# Patient Record
Sex: Female | Born: 1960 | Race: Black or African American | Hispanic: No | Marital: Single | Smoking: Former smoker
Health system: Southern US, Community
[De-identification: ages and names within clinical notes are randomized; demographics above are authoritative.]

## PROBLEM LIST (undated history)

## (undated) DIAGNOSIS — F32A Depression, unspecified: Secondary | ICD-10-CM

## (undated) DIAGNOSIS — M545 Low back pain, unspecified: Secondary | ICD-10-CM

## (undated) DIAGNOSIS — G8929 Other chronic pain: Secondary | ICD-10-CM

## (undated) DIAGNOSIS — F419 Anxiety disorder, unspecified: Secondary | ICD-10-CM

## (undated) DIAGNOSIS — J45998 Other asthma: Secondary | ICD-10-CM

## (undated) DIAGNOSIS — C50919 Malignant neoplasm of unspecified site of unspecified female breast: Secondary | ICD-10-CM

## (undated) DIAGNOSIS — R51 Headache: Secondary | ICD-10-CM

## (undated) DIAGNOSIS — D649 Anemia, unspecified: Secondary | ICD-10-CM

## (undated) DIAGNOSIS — M1990 Unspecified osteoarthritis, unspecified site: Secondary | ICD-10-CM

## (undated) DIAGNOSIS — R519 Headache, unspecified: Secondary | ICD-10-CM

## (undated) DIAGNOSIS — F329 Major depressive disorder, single episode, unspecified: Secondary | ICD-10-CM

## (undated) HISTORY — PX: BREAST LUMPECTOMY: SHX2

## (undated) HISTORY — PX: DILATION AND CURETTAGE OF UTERUS: SHX78

---

## 1997-08-06 ENCOUNTER — Ambulatory Visit (HOSPITAL_COMMUNITY): Admission: RE | Admit: 1997-08-06 | Discharge: 1997-08-06 | Payer: Self-pay | Admitting: *Deleted

## 1997-08-17 ENCOUNTER — Ambulatory Visit (HOSPITAL_COMMUNITY): Admission: RE | Admit: 1997-08-17 | Discharge: 1997-08-17 | Payer: Self-pay | Admitting: *Deleted

## 1997-10-15 ENCOUNTER — Ambulatory Visit (HOSPITAL_COMMUNITY): Admission: RE | Admit: 1997-10-15 | Discharge: 1997-10-15 | Payer: Self-pay | Admitting: *Deleted

## 1998-01-02 ENCOUNTER — Inpatient Hospital Stay (HOSPITAL_COMMUNITY): Admission: AD | Admit: 1998-01-02 | Discharge: 1998-01-02 | Payer: Self-pay | Admitting: *Deleted

## 1998-01-16 ENCOUNTER — Inpatient Hospital Stay (HOSPITAL_COMMUNITY): Admission: AD | Admit: 1998-01-16 | Discharge: 1998-01-18 | Payer: Self-pay | Admitting: *Deleted

## 1998-05-19 ENCOUNTER — Emergency Department (HOSPITAL_COMMUNITY): Admission: EM | Admit: 1998-05-19 | Discharge: 1998-05-19 | Payer: Self-pay | Admitting: Emergency Medicine

## 1999-12-31 ENCOUNTER — Encounter: Payer: Self-pay | Admitting: Emergency Medicine

## 1999-12-31 ENCOUNTER — Emergency Department (HOSPITAL_COMMUNITY): Admission: EM | Admit: 1999-12-31 | Discharge: 1999-12-31 | Payer: Self-pay

## 2002-01-31 ENCOUNTER — Emergency Department (HOSPITAL_COMMUNITY): Admission: EM | Admit: 2002-01-31 | Discharge: 2002-01-31 | Payer: Self-pay | Admitting: Emergency Medicine

## 2002-01-31 ENCOUNTER — Encounter: Payer: Self-pay | Admitting: Emergency Medicine

## 2002-11-13 ENCOUNTER — Other Ambulatory Visit: Admission: RE | Admit: 2002-11-13 | Discharge: 2002-11-13 | Payer: Self-pay | Admitting: *Deleted

## 2003-01-19 ENCOUNTER — Encounter: Admission: RE | Admit: 2003-01-19 | Discharge: 2003-01-19 | Payer: Self-pay | Admitting: Infectious Diseases

## 2003-12-17 ENCOUNTER — Other Ambulatory Visit: Admission: RE | Admit: 2003-12-17 | Discharge: 2003-12-17 | Payer: Self-pay | Admitting: *Deleted

## 2004-03-04 ENCOUNTER — Encounter: Admission: RE | Admit: 2004-03-04 | Discharge: 2004-03-04 | Payer: Self-pay | Admitting: *Deleted

## 2004-05-08 HISTORY — PX: BREAST BIOPSY: SHX20

## 2005-01-16 ENCOUNTER — Other Ambulatory Visit: Admission: RE | Admit: 2005-01-16 | Discharge: 2005-01-16 | Payer: Self-pay | Admitting: *Deleted

## 2005-05-24 ENCOUNTER — Encounter: Admission: RE | Admit: 2005-05-24 | Discharge: 2005-05-24 | Payer: Self-pay | Admitting: *Deleted

## 2005-06-14 ENCOUNTER — Encounter: Admission: RE | Admit: 2005-06-14 | Discharge: 2005-06-14 | Payer: Self-pay | Admitting: Pediatric Nephrology

## 2005-07-11 ENCOUNTER — Emergency Department (HOSPITAL_COMMUNITY): Admission: EM | Admit: 2005-07-11 | Discharge: 2005-07-11 | Payer: Self-pay | Admitting: Emergency Medicine

## 2005-12-21 ENCOUNTER — Encounter (INDEPENDENT_AMBULATORY_CARE_PROVIDER_SITE_OTHER): Payer: Self-pay | Admitting: *Deleted

## 2005-12-21 ENCOUNTER — Encounter: Admission: RE | Admit: 2005-12-21 | Discharge: 2005-12-21 | Payer: Self-pay | Admitting: *Deleted

## 2005-12-21 ENCOUNTER — Encounter (INDEPENDENT_AMBULATORY_CARE_PROVIDER_SITE_OTHER): Payer: Self-pay | Admitting: Radiology

## 2005-12-29 ENCOUNTER — Encounter: Admission: RE | Admit: 2005-12-29 | Discharge: 2005-12-29 | Payer: Self-pay | Admitting: *Deleted

## 2005-12-31 ENCOUNTER — Encounter: Admission: RE | Admit: 2005-12-31 | Discharge: 2005-12-31 | Payer: Self-pay | Admitting: *Deleted

## 2006-01-31 ENCOUNTER — Other Ambulatory Visit: Admission: RE | Admit: 2006-01-31 | Discharge: 2006-01-31 | Payer: Self-pay | Admitting: *Deleted

## 2006-06-08 ENCOUNTER — Encounter: Admission: RE | Admit: 2006-06-08 | Discharge: 2006-06-08 | Payer: Self-pay | Admitting: Obstetrics and Gynecology

## 2006-07-06 ENCOUNTER — Encounter: Admission: RE | Admit: 2006-07-06 | Discharge: 2006-07-06 | Payer: Self-pay | Admitting: Obstetrics and Gynecology

## 2006-07-13 ENCOUNTER — Ambulatory Visit (HOSPITAL_COMMUNITY): Admission: RE | Admit: 2006-07-13 | Discharge: 2006-07-13 | Payer: Self-pay | Admitting: *Deleted

## 2006-07-13 ENCOUNTER — Ambulatory Visit (HOSPITAL_BASED_OUTPATIENT_CLINIC_OR_DEPARTMENT_OTHER): Admission: RE | Admit: 2006-07-13 | Discharge: 2006-07-13 | Payer: Self-pay | Admitting: *Deleted

## 2006-07-13 ENCOUNTER — Encounter (INDEPENDENT_AMBULATORY_CARE_PROVIDER_SITE_OTHER): Payer: Self-pay | Admitting: Specialist

## 2006-07-13 ENCOUNTER — Encounter: Admission: RE | Admit: 2006-07-13 | Discharge: 2006-07-13 | Payer: Self-pay | Admitting: *Deleted

## 2006-07-13 HISTORY — PX: MASTECTOMY, PARTIAL: SHX709

## 2006-07-26 ENCOUNTER — Ambulatory Visit: Payer: Self-pay | Admitting: Oncology

## 2006-08-01 LAB — COMPREHENSIVE METABOLIC PANEL
BUN: 11 mg/dL (ref 6–23)
CO2: 26 mEq/L (ref 19–32)
Calcium: 9 mg/dL (ref 8.4–10.5)
Chloride: 106 mEq/L (ref 96–112)
Creatinine, Ser: 0.81 mg/dL (ref 0.40–1.20)
Glucose, Bld: 80 mg/dL (ref 70–99)
Total Bilirubin: 0.3 mg/dL (ref 0.3–1.2)

## 2006-08-01 LAB — CBC WITH DIFFERENTIAL/PLATELET
Basophils Absolute: 0 10*3/uL (ref 0.0–0.1)
HCT: 36.3 % (ref 34.8–46.6)
HGB: 12.5 g/dL (ref 11.6–15.9)
LYMPH%: 29.3 % (ref 14.0–48.0)
MCHC: 34.4 g/dL (ref 32.0–36.0)
MONO#: 0.4 10*3/uL (ref 0.1–0.9)
NEUT%: 59.4 % (ref 39.6–76.8)
Platelets: 346 10*3/uL (ref 145–400)
WBC: 4.9 10*3/uL (ref 3.9–10.0)
lymph#: 1.4 10*3/uL (ref 0.9–3.3)

## 2006-10-18 ENCOUNTER — Ambulatory Visit: Admission: RE | Admit: 2006-10-18 | Discharge: 2007-01-03 | Payer: Self-pay | Admitting: *Deleted

## 2007-02-21 ENCOUNTER — Encounter: Admission: RE | Admit: 2007-02-21 | Discharge: 2007-02-21 | Payer: Self-pay | Admitting: *Deleted

## 2007-05-20 IMAGING — MG MM MAMMO SCREENING
4 series · 4 of 4 positions shown · non-contrast
Comparison: none

SCREENING MAMMOGRAM:
There is a fibroglandular pattern.  Possible distortion is noted in the left breast.  Spot 
compression views and possibly sonography are recommended for further evaluation.  In the right 
breast, no masses or malignant type calcifications are identified.  Compared with prior studies.

[R CC]
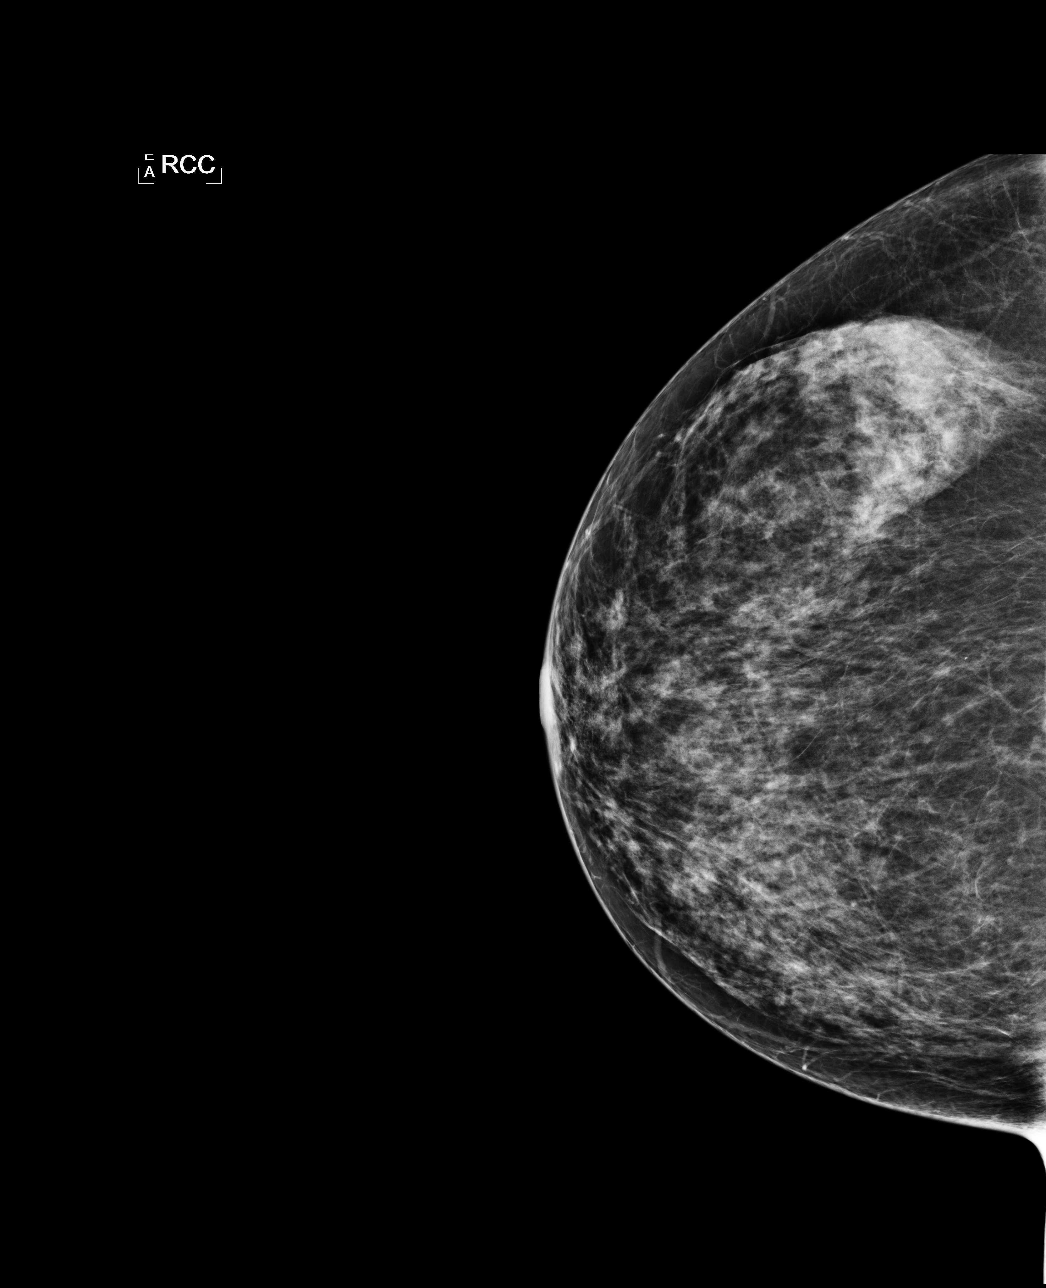

[R MLO]
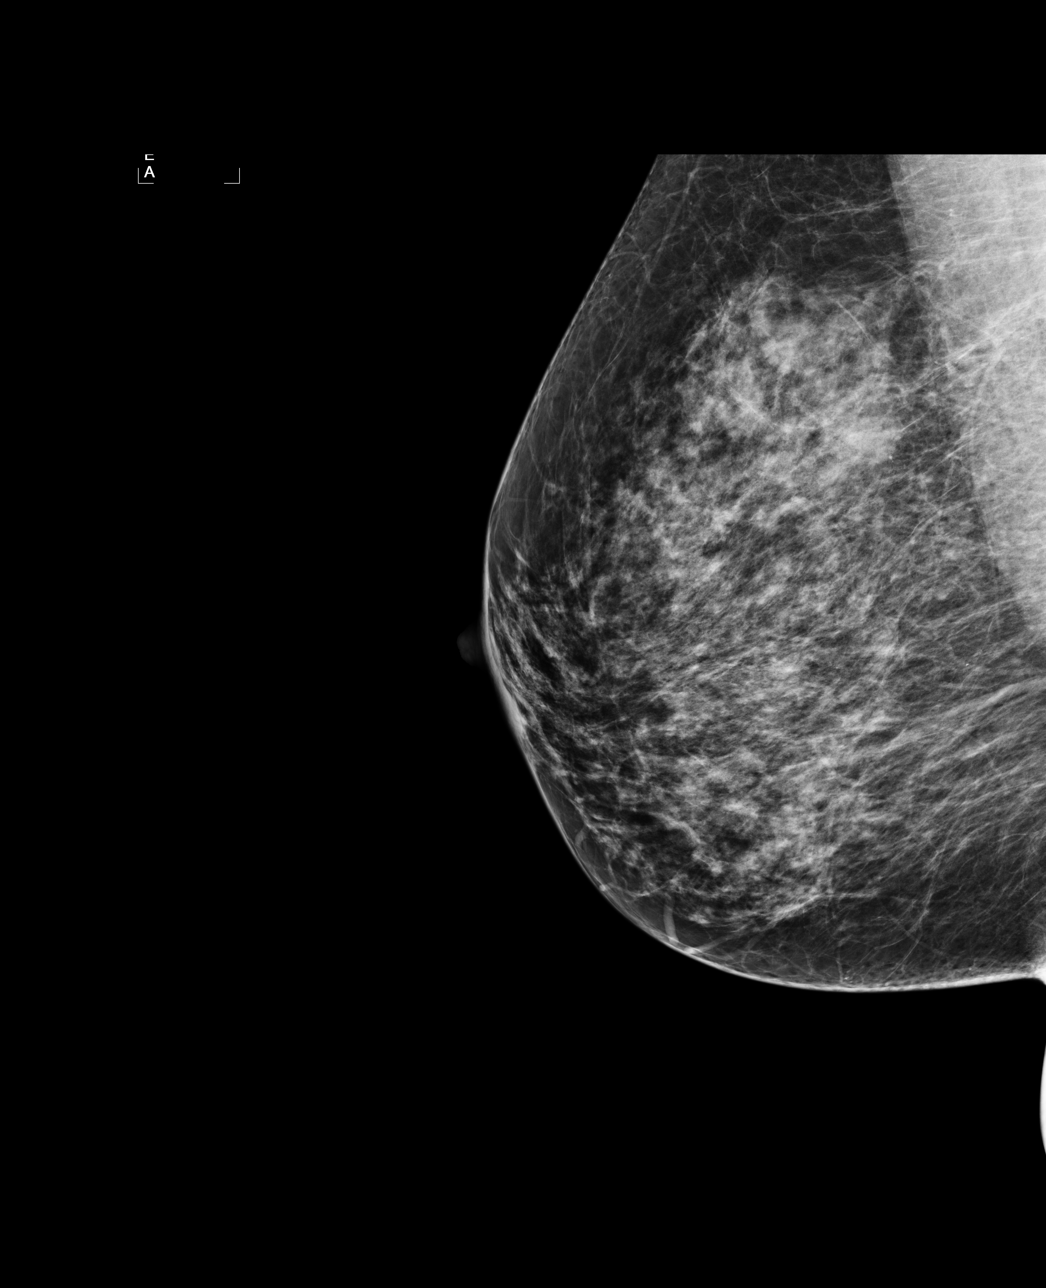

[L CC]
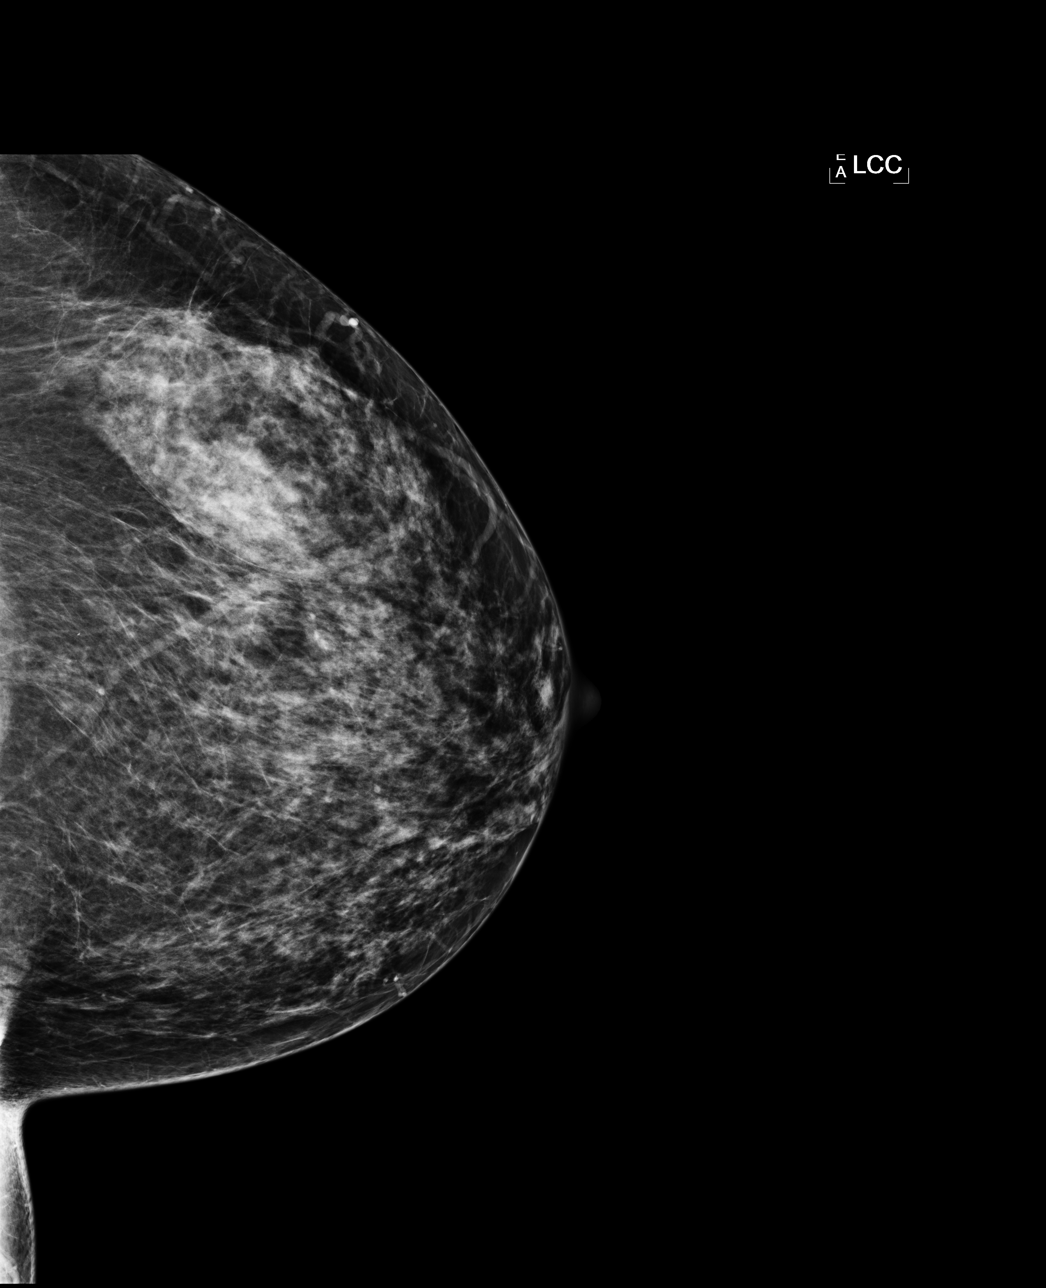

[L MLO]
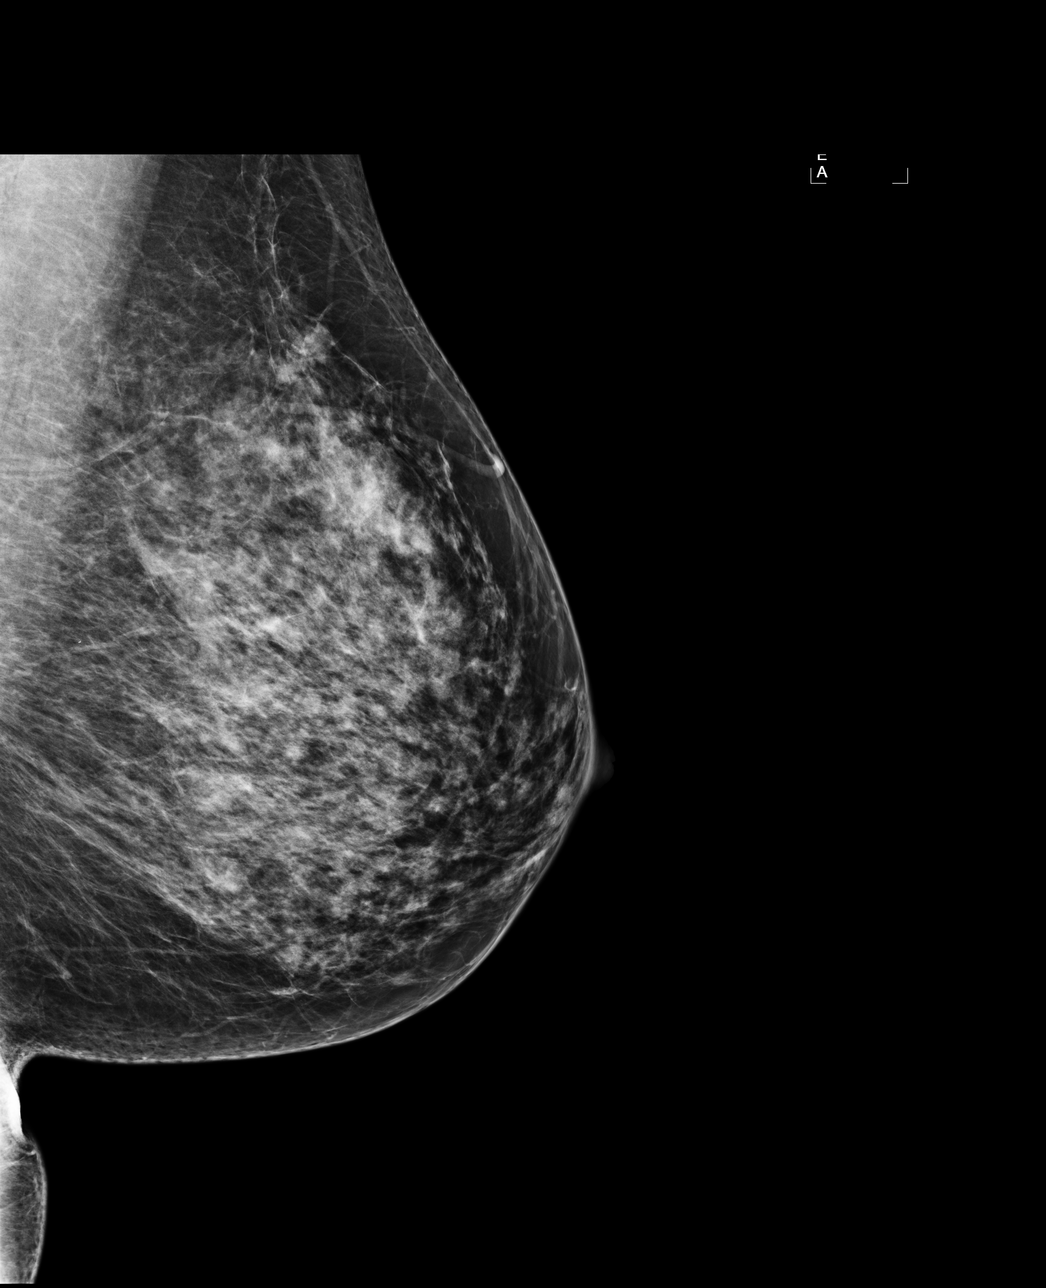

[4 of 4 positions shown; findings below may reference images not displayed]

IMPRESSION: Possible distortion, left breast.  Additional evaluation is indicated.  The patient will be 
contacted for additional studies and a supplementary report will follow.  No specific mammographic 
evidence of malignancy, right breast.

ASSESSMENT: Need additional imaging evaluation and/or prior mammograms for comparison - BI-RADS 0

Further imaging of the left breast.

## 2007-06-10 ENCOUNTER — Encounter: Admission: RE | Admit: 2007-06-10 | Discharge: 2007-06-10 | Payer: Self-pay | Admitting: Obstetrics and Gynecology

## 2007-06-16 ENCOUNTER — Encounter: Admission: RE | Admit: 2007-06-16 | Discharge: 2007-06-16 | Payer: Self-pay | Admitting: Family Medicine

## 2007-12-07 HISTORY — PX: TUBAL LIGATION: SHX77

## 2007-12-18 ENCOUNTER — Encounter: Admission: RE | Admit: 2007-12-18 | Discharge: 2007-12-18 | Payer: Self-pay | Admitting: Obstetrics and Gynecology

## 2007-12-31 ENCOUNTER — Ambulatory Visit (HOSPITAL_COMMUNITY): Admission: RE | Admit: 2007-12-31 | Discharge: 2007-12-31 | Payer: Self-pay | Admitting: Obstetrics and Gynecology

## 2008-06-07 ENCOUNTER — Emergency Department (HOSPITAL_COMMUNITY): Admission: EM | Admit: 2008-06-07 | Discharge: 2008-06-07 | Payer: Self-pay | Admitting: *Deleted

## 2008-06-10 ENCOUNTER — Encounter: Admission: RE | Admit: 2008-06-10 | Discharge: 2008-06-10 | Payer: Self-pay | Admitting: Obstetrics and Gynecology

## 2008-07-08 ENCOUNTER — Emergency Department (HOSPITAL_COMMUNITY): Admission: EM | Admit: 2008-07-08 | Discharge: 2008-07-08 | Payer: Self-pay | Admitting: Family Medicine

## 2009-06-11 ENCOUNTER — Encounter: Admission: RE | Admit: 2009-06-11 | Discharge: 2009-06-11 | Payer: Self-pay | Admitting: Obstetrics and Gynecology

## 2010-05-29 ENCOUNTER — Encounter: Payer: Self-pay | Admitting: *Deleted

## 2010-06-02 ENCOUNTER — Other Ambulatory Visit: Payer: Self-pay | Admitting: Internal Medicine

## 2010-06-02 DIAGNOSIS — Z9889 Other specified postprocedural states: Secondary | ICD-10-CM

## 2010-06-28 ENCOUNTER — Ambulatory Visit
Admission: RE | Admit: 2010-06-28 | Discharge: 2010-06-28 | Disposition: A | Discharge status: Home / Self Care | Payer: Medicaid Other | Source: Ambulatory Visit | Attending: Internal Medicine | Admitting: Internal Medicine

## 2010-06-28 DIAGNOSIS — Z9889 Other specified postprocedural states: Secondary | ICD-10-CM

## 2010-09-20 NOTE — Op Note (Signed)
Kristin Armstrong, Kristin Armstrong             ACCOUNT NO.:  [id number]   MEDICAL RECORD NO.:  [id number]          PATIENT TYPE:  AMB   LOCATION:  SDC                           FACILITY:  [facility]   PHYSICIAN:  Hal Morales, M.D.DATE OF BIRTH:  28-Oct-1960   DATE OF PROCEDURE:  12/31/2007  DATE OF DISCHARGE:                               OPERATIVE REPORT   PREOPERATIVE DIAGNOSES:  1. Right adnexal mass.  2. Desire for surgical sterilization.   POSTOPERATIVE DIAGNOSES:  1. Desire for surgical sterilization.  2. Uterine fibroids with a right broad ligament fibroid as the right      adnexal mass.   PROCEDURE:  1. Diagnostic laparoscopy.  2. Laparoscopic tubal cautery.   SURGEON:  Hal Morales, MD   ANESTHESIA:  General orotracheal.   ESTIMATED BLOOD LOSS:  Less than 10 mL.   COMPLICATIONS:  None.   FINDINGS:  The uterus was upper limits of normal size with an anterior  subserosal irregularity consistent with a fibroid on the anterior  fundus.  The right round ligament had directly adjacent and caudad to  it, a 3-cm fibroid which was consistent with the right adnexal mass that  had been seen on diagnostic imaging.  The fallopian tubes were normal  bilaterally.  The ovaries were normal bilaterally.  There were no  stigmata of endometriosis.  There were no excrescences.  The appendix  appeared normal.   PROCEDURE:  The patient was taken to the operating room after  appropriate identification and placed on the operating table.  After the  attainment of adequate general anesthesia, she was placed in the  modified lithotomy position.  The abdomen, perineum, and vagina were  prepped with multiple layers of Betadine and a Foley catheter inserted  into the bladder, then connected to straight drainage.  A Hulka  tenaculum was placed on the anterior cervix, and the abdomen and  perineum draped as a sterile field.  Subumbilical injection of 0.35%  Marcaine for a total of 5 mL and suprapubic  injection of 0.25% Marcaine  for a total of another 3 mL was undertaken.  A subumbilical incision was  made.  A Veress cannula was placed through the incision into the  peritoneal cavity.  A pneumoperitoneum was created with 3.5 L of CO2.  The laparoscopic trocar was placed through the subumbilical incision and  the laparoscope placed through the trocar sleeve.  The above-noted  findings were made and documented.  The right fallopian tube was  identified, followed to its fimbriated end, then grasped at the isthmic  portion and elevated.  The tube was cauterized in 2 adjacent areas.  A  similar procedure was carried out on the opposite side.  All instruments  were then removed from the peritoneal cavity under direct visualization  as the CO2 was allowed to escape.  The subumbilical incision was closed  with a 0 Vicryl figure-of-eight fascial suture and a subcuticular suture  of 3-0 Vicryl.  A sterile dressing was applied.  The Foley catheter and  Hulka tenaculum were removed.  The patient was awakened from general  anesthesia and taken  to the recovery room in satisfactory condition  having tolerated the procedure well with sponge and instrument counts  correct.  There were no specimens to pathology.  Discharge instructions  are printed instructions for laparoscopy from the [facility].   DISCHARGE MEDICATIONS:  1. Ibuprofen 600 mg p.o. q.6 h. for 3 days, then p.r.n. pain.  2. Vicodin 1-2 p.o. q.4 h. p.r.n. severe pain.   FOLLOW-UP:  The patient will follow up in 2 weeks with Dr. Pennie Rushing.      Hal Morales, M.D.  Electronically Signed     VPH/MEDQ  D:  12/31/2007  T:  01/01/2008  Job:  [id number]

## 2010-09-20 NOTE — H&P (Signed)
NAMEFARRELL, Kristin Armstrong             ACCOUNT NO.:  [id number]   MEDICAL RECORD NO.:  [id number]          PATIENT TYPE:  AMB   LOCATION:  SDC                           [facility]ACILITY:  [facility]   PHYSICIAN:  Hal Morales, M.D.DATE OF BIRTH:  1961-05-02   DATE OF ADMISSION:  12/31/2007  DATE OF DISCHARGE:                              HISTORY & PHYSICAL   HISTORY OF PRESENT ILLNESS:  The patient is a 50 year old black married  female para 2-0-1-2 who presents for operative evaluation of a right  adnexal mass.  The patient was seen for right lower quadrant pain workup  in March 2008 and an ultrasound showed a 2 cm right adnexal mass,  question of ovarian origin.  She had a normal CA-125.  There months  later on ultrasound this mass seemed to have resolved and the patient  had no further pain.  She had recurrence of her pain in July of 2009 at  which time she underwent a repeat ultrasound again showing a right  adnexal mass that did not seem to be associated with the right ovary but  could also not be shown to be connected to the uterus.  A followup MRI  on a December 18, 2007 still failed to identify the mass as a fibroid or  of ovarian origin.  There had been a minimal increase in size over the  16 months that had elapsed since her last MRI evaluation.  She had no  other signs of abnormality and specifically no evidence of adenomyosis.  The right and left ovaries showed small follicles but no specific  lesion.  The mass was thought to be separate from the ovary on the MRI.  The patient is now without abdominal pain, but wants to proceed for  evaluation of this right adnexal mass that may have been present for as  much as 18 months.   The patient had, in addition, a GC and chlamydia culture for workup of  her pelvic pain, both of which were negative.  The patient had  previously had a negative hepatitis B surface antigen, hepatitis C  antibody, HIV antibody, RPR, and HSV1.  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   Her last menstrual period was December 11, 2007.  Previous period had been  November 10, 2007.  Prior to that she had had 2 cycles during the month of  June with her cycles usually are 30 days apart.  The patient did  complain of some dyspareunia which is not a problem at this point.   She has a history of left hip pain and has had that evaluated with the  diagnosis of left hip bursitis.  Her most recent MRI for evaluation of  that was in 2009 and she has been treated with Naprosyn with good  relief.  She discontinued the Naprosyn 5 days ago in anticipation of her  surgery.   PAST MEDICAL HISTORY:  The patient was diagnosed with left breast cancer  in 2007.  The patient underwent a breast MRI and it was found to be  localized to  the left breast.  She underwent a lumpectomy and sentinel  node biopsy with the node biopsy being negative.  She subsequently  declined the recommended chemotherapy and tamoxifen but did do 4 months  of radiation therapy completing that in September 2008.  She continues  to decline any further therapy for her breast cancer and her last  mammogram was done February 2009.   SURGICAL HISTORY:  Removal of a Nabothian cysts at age 69 and D&C at age  81.  Third molar extraction.   CURRENT MEDICATIONS:  1. Allegra D p.r.n. which was also discontinued in anticipation of her      surgery.  2. Naprosyn discontinued 5 days ago in anticipation of surgery.   DRUG ALLERGIES:  None known.   FAMILY HISTORY:  Positive for hypertension but specifically negative for  ovarian or breast cancer.   REVIEW OF SYSTEMS:  Negative except as mentioned above.   PHYSICAL EXAMINATION:  CONSTITUTIONAL:  The patient is a well-developed  black female in no acute distress.  VITAL SIGNS:  Temperature 96.8, blood pressure 110/70.  Weight is 158  pounds, height 5 feet 6 inches.  LUNGS:  Clear.  HEART:  Regular rate and rhythm.  ABDOMEN:  Soft without masses or  organomegaly.  PELVIC:  EG and BUS within normal limits.  The vagina is rugous.  The  cervix is without gross lesion.  The uterus is normal size, shape and  consistency.  On the right, there is minimal ovarian enlargement with  mild tenderness.  Rectovaginal no masses.   IMPRESSION:  1. Persistent versus recurrent right adnexal mass with etiology being      uncertain.  It is not clear whether this is of ovarian origin,      tubal origin, or uterine origin as in a small fibroid.  2. History of left breast cancer with no evidence of disease but with      the patient having declined recommended therapy in terms of      chemotherapy.  3. The patient states a desire for tubal sterilization.   DISPOSITION:  1. Several discussions have been held with the patient concerning      options for management of the adnexal mass.  She is adamant that      she must not be away from work for a prolonged period of time but      has consented to undergo laparoscopic evaluation of the right      adnexal mass and removal, if possible through the laparoscope.  She      does not want to undergo laparotomy for removal of this mass.  She      will thus undergo laparoscopy and possible removal of the right      adnexa and if the right ovary is attached to this, possible removal      of the right ovary.  2. She has discussed the desire to undergo tubal sterilization and      laparoscopic tubal sterilization will be performed simultaneously      with this procedure.  She understands that this is considered a      permanent procedure, but that there is a small risk of failure.      The risks of anesthesia, bleeding, infection, damage to adjacent      organs for the entire procedure have been explained and the patient      wishes to proceed.  This will occur at [facility] on December 31, 2007.  She will undergo a bowel prep prior to her surgery.      Hal Morales, M.D.  Electronically  Signed     VPH/MEDQ  D:  12/30/2007  T:  12/30/2007  Job:  [id number]

## 2010-09-23 NOTE — Op Note (Signed)
Kristin Armstrong, Kristin Armstrong             ACCOUNT NO.:  [id number]   MEDICAL RECORD NO.:  [id number]          PATIENT TYPE:  AMB   LOCATION:  DSC                          FACILITY:  [facility]   PHYSICIAN:  Alfonse Ras, MD   DATE OF BIRTH:  1960-11-20   DATE OF PROCEDURE:  07/13/2006  DATE OF DISCHARGE:                               OPERATIVE REPORT   PREOPERATIVE DIAGNOSIS:  Invasive left breast cancer.   POSTOPERATIVE DIAGNOSIS:  Invasive left breast cancer.   PROCEDURE:  Needle-localization left partial mastectomy and sentinel  lymph node biopsy.   SURGEON:  Alfonse Ras, MD   ANESTHESIA:  General.   DESCRIPTION:  The patient was taken to the operating room after having  needle localization of the area in the left breast and injection and her  periareolar region with technetium sulfur colloid.  The breast was then  prepped and draped in the normal sterile fashion after general  anesthesia was induced.  Using 5 mL of methylene blue dye, I injected  the subareolar position in the left breast.  Though wire was cut down to  size, an incision was made over the wire.  I dissected down to about 5-6  cm until the tip of the wire could easily be palpated and all tissue  surrounding the wire was excised en bloc.  Adequate hemostasis was  ensured.  Specimen mammography verified the presence of the mass and  clip.  The skin was closed with subcuticular 4-0 Monocryl.   I then turned my attention to the left axilla where there was a high  area of activity in the axilla.  An incision was made down over this.  I  dissected down to subcutaneous tissue using Bovie electrocautery and to  the axillary fat pad.  A hot blue lymph node was easily identified, was  dissected.  Out adequate hemostasis was ensured.  That pathology is  pending at this time.  Skin was closed with a 4-0 Monocryl, Steri-Strips  were applied.  If there is any change in that pathology report, which I  presume was going to be  benign, I will dictate an addendum.      Alfonse Ras, MD  Electronically Signed     KRE/MEDQ  D:  07/13/2006  T:  07/13/2006  Job:  [id number]

## 2010-12-14 ENCOUNTER — Emergency Department (HOSPITAL_COMMUNITY)
Admission: EM | Admit: 2010-12-14 | Discharge: 2010-12-15 | Disposition: A | Discharge status: Home / Self Care | Payer: PRIVATE HEALTH INSURANCE | Attending: Emergency Medicine | Admitting: Emergency Medicine

## 2010-12-14 DIAGNOSIS — M545 Low back pain, unspecified: Secondary | ICD-10-CM | POA: Insufficient documentation

## 2010-12-14 DIAGNOSIS — Z853 Personal history of malignant neoplasm of breast: Secondary | ICD-10-CM | POA: Insufficient documentation

## 2011-02-16 ENCOUNTER — Other Ambulatory Visit: Payer: Self-pay | Admitting: Obstetrics and Gynecology

## 2011-02-16 DIAGNOSIS — R2230 Localized swelling, mass and lump, unspecified upper limb: Secondary | ICD-10-CM

## 2011-02-16 DIAGNOSIS — N644 Mastodynia: Secondary | ICD-10-CM

## 2011-02-17 ENCOUNTER — Ambulatory Visit
Admission: RE | Admit: 2011-02-17 | Discharge: 2011-02-17 | Disposition: A | Discharge status: Home / Self Care | Payer: PRIVATE HEALTH INSURANCE | Source: Ambulatory Visit | Attending: Obstetrics and Gynecology | Admitting: Obstetrics and Gynecology

## 2011-02-17 DIAGNOSIS — R2230 Localized swelling, mass and lump, unspecified upper limb: Secondary | ICD-10-CM

## 2011-02-17 DIAGNOSIS — N644 Mastodynia: Secondary | ICD-10-CM

## 2011-05-23 ENCOUNTER — Other Ambulatory Visit: Payer: Self-pay | Admitting: Obstetrics and Gynecology

## 2011-05-23 DIAGNOSIS — Z853 Personal history of malignant neoplasm of breast: Secondary | ICD-10-CM

## 2011-05-23 DIAGNOSIS — Z9889 Other specified postprocedural states: Secondary | ICD-10-CM

## 2011-07-05 ENCOUNTER — Other Ambulatory Visit: Payer: Self-pay | Admitting: Obstetrics and Gynecology

## 2011-07-05 ENCOUNTER — Ambulatory Visit
Admission: RE | Admit: 2011-07-05 | Discharge: 2011-07-05 | Disposition: A | Discharge status: Home / Self Care | Payer: PRIVATE HEALTH INSURANCE | Source: Ambulatory Visit | Attending: Obstetrics and Gynecology | Admitting: Obstetrics and Gynecology

## 2011-07-05 DIAGNOSIS — Z853 Personal history of malignant neoplasm of breast: Secondary | ICD-10-CM

## 2011-07-05 DIAGNOSIS — Z9889 Other specified postprocedural states: Secondary | ICD-10-CM

## 2011-07-31 ENCOUNTER — Other Ambulatory Visit: Payer: Self-pay | Admitting: Obstetrics and Gynecology

## 2011-07-31 DIAGNOSIS — Z9889 Other specified postprocedural states: Secondary | ICD-10-CM

## 2011-07-31 DIAGNOSIS — Z853 Personal history of malignant neoplasm of breast: Secondary | ICD-10-CM

## 2011-09-02 ENCOUNTER — Ambulatory Visit
Admission: RE | Admit: 2011-09-02 | Discharge: 2011-09-02 | Disposition: A | Discharge status: Home / Self Care | Payer: PRIVATE HEALTH INSURANCE | Source: Ambulatory Visit | Attending: Obstetrics and Gynecology | Admitting: Obstetrics and Gynecology

## 2011-09-02 DIAGNOSIS — Z853 Personal history of malignant neoplasm of breast: Secondary | ICD-10-CM

## 2011-09-02 DIAGNOSIS — Z9889 Other specified postprocedural states: Secondary | ICD-10-CM

## 2011-09-02 MED ORDER — GADOBENATE DIMEGLUMINE 529 MG/ML IV SOLN
15.0000 mL | Freq: Once | INTRAVENOUS | Status: AC | PRN
  Administered 2011-09-02: 15 mL via INTRAVENOUS

## 2012-02-22 ENCOUNTER — Encounter: Payer: Self-pay | Admitting: Obstetrics and Gynecology

## 2012-06-05 ENCOUNTER — Other Ambulatory Visit: Payer: Self-pay | Admitting: Obstetrics and Gynecology

## 2012-06-05 DIAGNOSIS — Z853 Personal history of malignant neoplasm of breast: Secondary | ICD-10-CM

## 2012-06-13 ENCOUNTER — Other Ambulatory Visit: Payer: Self-pay | Admitting: Obstetrics and Gynecology

## 2012-06-13 ENCOUNTER — Ambulatory Visit
Admission: RE | Admit: 2012-06-13 | Discharge: 2012-06-13 | Disposition: A | Discharge status: Home / Self Care | Payer: PRIVATE HEALTH INSURANCE | Source: Ambulatory Visit | Attending: Obstetrics and Gynecology | Admitting: Obstetrics and Gynecology

## 2012-06-13 DIAGNOSIS — N6320 Unspecified lump in the left breast, unspecified quadrant: Secondary | ICD-10-CM

## 2012-06-20 ENCOUNTER — Encounter: Payer: Self-pay | Admitting: Obstetrics and Gynecology

## 2012-07-03 ENCOUNTER — Other Ambulatory Visit: Payer: Self-pay | Admitting: Obstetrics and Gynecology

## 2012-07-09 ENCOUNTER — Encounter: Payer: Self-pay | Admitting: Obstetrics and Gynecology

## 2012-07-09 ENCOUNTER — Ambulatory Visit: Payer: PRIVATE HEALTH INSURANCE | Admitting: Obstetrics and Gynecology

## 2012-07-09 VITALS — BP 112/68 | Resp 14 | Ht 65.5 in | Wt 156.0 lb

## 2012-07-09 DIAGNOSIS — Z01419 Encounter for gynecological examination (general) (routine) without abnormal findings: Secondary | ICD-10-CM

## 2012-07-09 DIAGNOSIS — Z124 Encounter for screening for malignant neoplasm of cervix: Secondary | ICD-10-CM

## 2012-07-09 DIAGNOSIS — C50919 Malignant neoplasm of unspecified site of unspecified female breast: Secondary | ICD-10-CM | POA: Insufficient documentation

## 2012-07-09 NOTE — Progress Notes (Signed)
Patient ID: MAKINLEE AWWAD, female   DOB: [birth year], 52 y.o.   MRN: [id number] Last Pap: 01/2011 wnl WNL: Yes Regular Periods:no Contraception: None  Monthly Breast exam:yes Tetanus<58yrs:no Nl.Bladder Function:yes Daily BMs:yes Healthy Diet:yes Calcium:yes Mammogram:yes Date of Mammogram: 07/05/2012 wnl Exercise:yes Have often Exercise: 3x q wk Seatbelt: yes Abuse at home: no Stressful work:no Sigmoid-colonoscopyNever Bone Density: No PCP: Dr. Angelene Giovanni Change in PMH: no h/o breast cancer Change in FMH:no BP 112/68  Resp 14  Ht 5' 5.5" (1.664 m)  Wt 156 lb (70.761 kg)  BMI 25.56 kg/m2  LMP 08/26/2011 Pt with complaints:no Physical Examination: General appearance - alert, well appearing, and in no distress Mental status - normal mood, behavior, speech, dress, motor activity, and thought processes Neck - supple, no significant adenopathy,  thyroid exam: thyroid is normal in size without nodules or tenderness Chest - clear to auscultation, no wheezes, rales or rhonchi, symmetric air entry Heart - normal rate and regular rhythm Abdomen - soft, nontender, nondistended, no masses or organomegaly Breasts - breasts appear normal, no suspicious masses, no skin or nipple changes or axillary nodes Pelvic - normal external genitalia, vulva, vagina, cervix, uterus and adnexa Rectal - normal rectal, no masses Back exam - full range of motion, no tenderness, palpable spasm or pain on motion Neurological - alert, oriented, normal speech, no focal findings or movement disorder noted Musculoskeletal - no joint tenderness, deformity or swelling Extremities - no edema, redness or tenderness in the calves or thighs Skin - normal coloration and turgor, no rashes, no suspicious skin lesions noted Routine exam Pap sent yes Mammogram due no.  Pt in remission from breast cancer  nothing used for contraception RT 1 yr Will schedule colonscopy

## 2012-07-11 LAB — PAP IG W/ RFLX HPV ASCU

## 2012-10-17 ENCOUNTER — Other Ambulatory Visit: Payer: Self-pay

## 2012-10-17 DIAGNOSIS — Z1231 Encounter for screening mammogram for malignant neoplasm of breast: Secondary | ICD-10-CM

## 2013-01-28 ENCOUNTER — Emergency Department (HOSPITAL_COMMUNITY): Payer: PRIVATE HEALTH INSURANCE

## 2013-01-28 ENCOUNTER — Observation Stay (HOSPITAL_COMMUNITY)
Admission: EM | Admit: 2013-01-28 | Discharge: 2013-01-29 | Disposition: A | Discharge status: Home / Self Care | Payer: PRIVATE HEALTH INSURANCE | Attending: Internal Medicine | Admitting: Internal Medicine

## 2013-01-28 ENCOUNTER — Observation Stay (HOSPITAL_COMMUNITY): Payer: PRIVATE HEALTH INSURANCE

## 2013-01-28 ENCOUNTER — Encounter (HOSPITAL_COMMUNITY): Payer: Self-pay

## 2013-01-28 DIAGNOSIS — C50919 Malignant neoplasm of unspecified site of unspecified female breast: Secondary | ICD-10-CM

## 2013-01-28 DIAGNOSIS — Z853 Personal history of malignant neoplasm of breast: Secondary | ICD-10-CM | POA: Insufficient documentation

## 2013-01-28 DIAGNOSIS — Z23 Encounter for immunization: Secondary | ICD-10-CM | POA: Insufficient documentation

## 2013-01-28 DIAGNOSIS — R55 Syncope and collapse: Principal | ICD-10-CM | POA: Insufficient documentation

## 2013-01-28 HISTORY — DX: Anxiety disorder, unspecified: F41.9

## 2013-01-28 HISTORY — DX: Major depressive disorder, single episode, unspecified: F32.9

## 2013-01-28 HISTORY — DX: Headache: R51

## 2013-01-28 HISTORY — DX: Low back pain, unspecified: M54.50

## 2013-01-28 HISTORY — DX: Anemia, unspecified: D64.9

## 2013-01-28 HISTORY — DX: Headache, unspecified: R51.9

## 2013-01-28 HISTORY — DX: Other asthma: J45.998

## 2013-01-28 HISTORY — DX: Low back pain: M54.5

## 2013-01-28 HISTORY — DX: Malignant neoplasm of unspecified site of unspecified female breast: C50.919

## 2013-01-28 HISTORY — DX: Depression, unspecified: F32.A

## 2013-01-28 HISTORY — DX: Other chronic pain: G89.29

## 2013-01-28 HISTORY — DX: Unspecified osteoarthritis, unspecified site: M19.90

## 2013-01-28 LAB — URINALYSIS, ROUTINE W REFLEX MICROSCOPIC
Glucose, UA: NEGATIVE mg/dL
Ketones, ur: NEGATIVE mg/dL
Specific Gravity, Urine: 1.011 (ref 1.005–1.030)
pH: 8 (ref 5.0–8.0)

## 2013-01-28 LAB — D-DIMER, QUANTITATIVE: D-Dimer, Quant: <0.27 ug/mL-FEU (ref 0.00–0.48)

## 2013-01-28 LAB — COMPREHENSIVE METABOLIC PANEL
Albumin: 3.9 g/dL (ref 3.5–5.2)
Alkaline Phosphatase: 110 U/L (ref 39–117)
BUN: 15 mg/dL (ref 6–23)
Calcium: 9.5 mg/dL (ref 8.4–10.5)
Potassium: 4.2 mEq/L (ref 3.5–5.1)
Sodium: 139 mEq/L (ref 135–145)
Total Protein: 7.5 g/dL (ref 6.0–8.3)

## 2013-01-28 LAB — URINE MICROSCOPIC-ADD ON

## 2013-01-28 LAB — CBC WITH DIFFERENTIAL/PLATELET
Basophils Relative: 0 % (ref 0–1)
Eosinophils Absolute: 0.3 10*3/uL (ref 0.0–0.7)
MCH: 29.1 pg (ref 26.0–34.0)
MCHC: 35.5 g/dL (ref 30.0–36.0)
Monocytes Relative: 6 % (ref 3–12)
Neutrophils Relative %: 62 % (ref 43–77)
Platelets: 229 10*3/uL (ref 150–400)

## 2013-01-28 LAB — POCT I-STAT TROPONIN I

## 2013-01-28 LAB — POCT PREGNANCY, URINE: Preg Test, Ur: NEGATIVE

## 2013-01-28 MED ORDER — SODIUM CHLORIDE 0.9 % IJ SOLN: 3.0000 mL | Freq: Two times a day (BID) | INTRAMUSCULAR | Status: DC

## 2013-01-28 MED ORDER — ENOXAPARIN SODIUM 40 MG/0.4ML ~~LOC~~ SOLN
40.0000 mg | SUBCUTANEOUS | Status: DC
  Administered 2013-01-28: 40 mg via SUBCUTANEOUS
  Filled 2013-01-28 (×2): qty 0.4

## 2013-01-28 MED ORDER — HYDROCODONE-ACETAMINOPHEN 5-325 MG PO TABS: 1.0000 | ORAL_TABLET | ORAL | Status: DC | PRN

## 2013-01-28 MED ORDER — ACETAMINOPHEN 325 MG PO TABS
650.0000 mg | ORAL_TABLET | Freq: Once | ORAL | Status: AC
  Administered 2013-01-28: 650 mg via ORAL
  Filled 2013-01-28: qty 2

## 2013-01-28 MED ORDER — ONDANSETRON HCL 4 MG PO TABS: 4.0000 mg | ORAL_TABLET | Freq: Four times a day (QID) | ORAL | Status: DC | PRN

## 2013-01-28 MED ORDER — MELOXICAM 7.5 MG PO TABS
7.5000 mg | ORAL_TABLET | Freq: Every day | ORAL | Status: DC | PRN
  Filled 2013-01-28: qty 1

## 2013-01-28 MED ORDER — LORAZEPAM 0.5 MG PO TABS
0.5000 mg | ORAL_TABLET | Freq: Two times a day (BID) | ORAL | Status: DC | PRN
  Administered 2013-01-28: 0.5 mg via ORAL
  Filled 2013-01-28: qty 1

## 2013-01-28 MED ORDER — ADULT MULTIVITAMIN W/MINERALS CH
1.0000 | ORAL_TABLET | Freq: Every day | ORAL | Status: DC
  Administered 2013-01-29: 1 via ORAL
  Filled 2013-01-28: qty 1

## 2013-01-28 MED ORDER — SODIUM CHLORIDE 0.9 % IV SOLN
INTRAVENOUS | Status: AC
  Administered 2013-01-28 (×2): via INTRAVENOUS

## 2013-01-28 MED ORDER — ACETAMINOPHEN 325 MG PO TABS: 650.0000 mg | ORAL_TABLET | Freq: Four times a day (QID) | ORAL | Status: DC | PRN

## 2013-01-28 MED ORDER — ONDANSETRON HCL 4 MG/2ML IJ SOLN: 4.0000 mg | Freq: Four times a day (QID) | INTRAMUSCULAR | Status: DC | PRN

## 2013-01-28 MED ORDER — ACETAMINOPHEN 650 MG RE SUPP: 650.0000 mg | Freq: Four times a day (QID) | RECTAL | Status: DC | PRN

## 2013-01-28 MED ORDER — MULTI-VITAMIN/MINERALS PO TABS: 1.0000 | ORAL_TABLET | Freq: Every day | ORAL | Status: DC

## 2013-01-28 MED ORDER — ENOXAPARIN SODIUM 40 MG/0.4ML ~~LOC~~ SOLN: 40.0000 mg | SUBCUTANEOUS | Status: DC

## 2013-01-28 NOTE — ED Notes (Signed)
Patient stated she remember getting up to turn on the fan (before the children got up) and the next thing she remembers she was on the floor.  C/c pain to the back og her head, right shoulder and right buttuck  Stated she had a syncopal episode like this when she was a teenager but none since then.

## 2013-01-28 NOTE — Progress Notes (Signed)
EEG completed; results pending.    

## 2013-01-28 NOTE — ED Provider Notes (Addendum)
Medical screening examination/treatment/procedure(s) were conducted as a shared visit with non-physician practitioner(s) and myself.  I personally evaluated the patient during the encounter  Pt with syncope.  Pt has been under stress, not feeling well, takes muscle relaxant and had one last night.  Had gotten up this AM like usual, nothing too different from routine, apparently knocked over mirror in hallway, had syncope, likely mild head injury, but certainly LOC with no memory of event.  Possibly seizure, but unlikely.  No CP, SOB.  However doesn't recall being dizzy, getting diaphoretic, nauseated and light headed prior to event.  No PE risk factors except h/o cancer remotely.  ECG shows no arrythmia, ischemia.  May benefit from brief observation admission for syncope.  Will get labs including CBC, electrolytes, troponin.  PCP is Dr. Wynelle Link.  Currently, no distress, no CP, lungs clear, HR is regular.  No abd tenderness.  No cervical pain, tenderness with FROM.       Gavin Pound. Jasneet Schobert, MD 01/28/13 0902  10:24 AM Pt's mother informs [place] that pt had a history of passing out when she was younger.  Pt again still denies recalling any specifics regarding her syncope this AM, seems to be no preceding light headedness.  She thinks she may have been dizzy, but is not definitive.  However, no CP, SOB, no back pain.  I recommended that the patient be observed for 23 hours on telemetry to ensure no cardiac arrythmia, or develops any other sign symptoms.  Pt is agreeable.    Gavin Pound. Jullian Previti, MD 01/28/13 1025

## 2013-01-28 NOTE — ED Notes (Signed)
Spoke with Service [employer] Thayer Ohm) and ordered a regular diet tray per [place], [place]

## 2013-01-28 NOTE — H&P (Signed)
[facility] History and Physical  NEVENA ROZENBERG MVH:[id number] DOB: 1961/03/28 DOA: 01/28/2013  Referring physician:  Oletta Lamas PCP: Leanor Rubenstein, MD   Chief Complaint: syncope  HPI: Kristin Armstrong is a 52 y.o. female who came to ED via EMS after a syncopal episode.  Family reported they heard a thud, then a crash.  Patient had broken a wall mirror and was unresponsive for about 5 minutes.  She resportedly had a brief period of arm and head shaking.  When she opened here eyes, she appeared "glazed" and confused.  No incontinence.  No lip or tongue lacerations.  Patient reports remembering getting out of bed, then EMS arriving.  She has h/o syncope as a teenager.  No seizure history.  No reported chest pain, palpitations, dyspnea.  She currently feels tired and weak.  She has not been eating well for the past week due to nausea, generalized malaise and fatigue.  She had a single episode of diarrhea a few days ago.  No F/C.  10 lb unintentional weight loss recently. Significant life stressors.  Feels depressed and anxious.  Review of Systems: systems reviewed.  As above, otherwise negative.  Past Medical History  Diagnosis Date  . Cancer     breast   Past Surgical History  Procedure Laterality Date  . Dilation and curettage of uterus    . Breast lumpectomy Left    Social History: does not smoke or use drugs. Occasional alcohol.  Single mother caring for 2 teenagers and a 52 year old grand-niece.  Recently broke up with her boyfriend who was cheating on her  Allergies  Allergen Reactions  . Dust Mite Extract Other (See Comments)    sneezing  . Pollen Extract Other (See Comments)    sneezing   FH:  Heart disease.  Prior to Admission medications   Medication Sig Start Date End Date Taking? Authorizing Provider  fexofenadine-pseudoephedrine (ALLEGRA-D 24) 180-240 MG per 24 hr tablet Take 1 tablet by mouth daily.   Yes Historical Provider, MD  meloxicam (MOBIC) 7.5 MG tablet Take  7.5 mg by mouth as needed for pain.   Yes Historical Provider, MD  Multiple Vitamins-Minerals (MULTIVITAMIN WITH MINERALS) tablet Take 1 tablet by mouth daily.   Yes Historical Provider, MD  Tetrahydrozoline HCl (EYE DROPS OP) Place 1 drop into both eyes as needed (itching).   Yes Historical Provider, MD   Physical Exam: Filed Vitals:   01/28/13 1416  BP: 105/65  Pulse: 75  Temp: 98.5 F (36.9 C)  Resp: 18   BP 105/65  Pulse 75  Temp(Src) 98.5 F (36.9 C) (Oral)  Resp 18  Ht 5\' 11"  (1.803 m)  Wt 69.627 kg (153 lb 8 oz)  BMI 21.42 kg/m2  SpO2 97%  LMP 08/26/2011  General Appearance:    Tired appearing. Oriented and cooperative  Head:    Normocephalic, without obvious abnormality, atraumatic  Eyes:    PERRL, conjunctiva/corneas clear, EOM's intact, fundi    benign, both eyes     Nose:   Nares normal, septum midline, mucosa normal, no drainage    or sinus tenderness  Throat:   Lips, mucosa, and tongue normal; teeth and gums normal  Neck:   Supple, symmetrical, trachea midline, no adenopathy;    thyroid:  no enlargement/tenderness/nodules; no carotid   bruit or JVD  Back:     Symmetric, no curvature, ROM normal, no CVA tenderness  Lungs:     Clear to auscultation bilaterally, respirations unlabored  Chest  Wall:    No tenderness or deformity   Heart:    Regular rate and rhythm, S1 and S2 normal, no murmur, rub   or gallop     Abdomen:     Soft, non-tender, bowel sounds active all four quadrants,    no masses, no organomegaly  Genitalia:    deferred  Rectal:    deferred  Extremities:   Extremities normal, atraumatic, no cyanosis or edema  Pulses:   2+ and symmetric all extremities  Skin:   Skin color, texture, turgor normal, no rashes or lesions  Lymph nodes:   Cervical, supraclavicular, and axillary nodes normal  Neurologic:   CNII-XII intact, normal strength, sensation and reflexes    throughout    Psychiatric:  Sad affect  Labs on Admission:  Basic Metabolic  Panel:  Recent Labs Lab 01/28/13 0820  NA 139  K 4.2  CL 102  CO2 27  GLUCOSE 96  BUN 15  CREATININE 0.90  CALCIUM 9.5   Liver Function Tests:  Recent Labs Lab 01/28/13 0820  AST 17  ALT 13  ALKPHOS 110  BILITOT 0.4  PROT 7.5  ALBUMIN 3.9   No results found for this basename: LIPASE, AMYLASE,  in the last 168 hours No results found for this basename: AMMONIA,  in the last 168 hours CBC:  Recent Labs Lab 01/28/13 0820  WBC 5.0  NEUTROABS 3.1  HGB 14.3  HCT 40.3  MCV 82.1  PLT 229   Cardiac Enzymes: No results found for this basename: CKTOTAL, CKMB, CKMBINDEX, TROPONINI,  in the last 168 hours  BNP (last 3 results) No results found for this basename: PROBNP,  in the last 8760 hours CBG: No results found for this basename: GLUCAP,  in the last 168 hours  Radiological Exams on Admission: Dg Chest 2 View  01/28/2013   CLINICAL DATA:  No chest complaints. Syncope.  EXAM: CHEST  2 VIEW  COMPARISON:  06/07/2008.  FINDINGS: The cardiopericardial silhouette and mediastinal contours are within normal limits. Partially radiopaque monitoring buttons project over the chest. There is no airspace disease or effusion. Trachea midline.  IMPRESSION: No active cardiopulmonary disease.   Electronically Signed   By: Andreas Newport M.D.   On: 01/28/2013 07:59   Ct Head Wo Contrast  01/28/2013   CLINICAL DATA:  Syncope  EXAM: CT HEAD WITHOUT CONTRAST  TECHNIQUE: Contiguous axial images were obtained from the base of the skull through the vertex without intravenous contrast.  COMPARISON:  None.  FINDINGS: No mass effect, midline shift, or acute intracranial hemorrhage. Minimal chronic ischemic changes in the periventricular white matter of the occipital lobes. Extra-axial space is unremarkable. Mastoid air cells are clear. Minimal mucus in the right sphenoid sinus. Slight nasal septal deviation to the right. Cranium is intact.  IMPRESSION: No acute intracranial pathology.    Electronically Signed   By: Maryclare Bean M.D.   On: 01/28/2013 08:11    EKG: not in Epic.  Per ED, normal  Assessment/Plan  Syncope:  D-dimer normal.  EKG normal.  Monitor on telemetry. Echo. EEG.  IVF.  Orthostatics normal.  Has not been eating well due to nausea, malaise and home stressors.  May have had transient hypotension, and subsequent concussion. Versus arrhythmia. Versus seizure.  H/o breast cancer in remission  Code Status: full Family Communication: mother, teenage children Disposition Plan: home  Time spent: 60 min  Christiane Ha [facility] Pager [phone]  If 7PM-7AM, please contact night-coverage [email or web address] Password [facility] 01/28/2013,  6:59 PM

## 2013-01-28 NOTE — Procedures (Signed)
ELECTROENCEPHALOGRAM REPORT   Patient: Kristin Armstrong       Room #: 4N82 EEG No. ID: [id number] Age: 52 y.o.        Sex: female Referring Physician: Paris Lore  Report Date:  01/28/2013        Interpreting Physician: Aline Brochure  History: Kristin Armstrong is an 52 y.o. female admitted following an episode of loss of consciousness at home, thought to likely be due to syncope.  Indications for study:  Rule out seizure disorder.  Technique: This is an 18 channel routine scalp EEG performed at the bedside with bipolar and monopolar montages arranged in accordance to the international 10/20 system of electrode placement.   Description: This EEG recording was performed during wakefulness and during sleep. Background activity during wakefulness consisted of diffuse slowing to the activity as well as 92 Hz alpha rhythm recorded intermittently from a chair head regions. Photic stimulation produced a symmetrical occipital driving response. Hyperventilation produced normal symmetrical generalized transient slowing response. During sleep there was slowing of background activity which was symmetrical along with normal sleep spindles, vertex waves and arousal responses. No epileptiform discharges occurred during wakefulness nor during sleep. There was no abnormal slowing.  Interpretation: This is a normal EEG recording during wakefulness and during sleep. No evidence of an epileptic disorder was recorded.   Venetia Maxon M.D. [facility] [phone]

## 2013-01-28 NOTE — ED Notes (Addendum)
Per EMS, pt got out of bed this AM and felt dizzy. Had a syncopal episode, unconsciousness lasting 2-3 minutes. Upon FD arrival patient only responding to verbal stimulation. Upon EMS arrival patient alert and oriented x4. C/o pain to back of head, right shoulder, and right hip. VSS. Pt. C/o nausea and increased stress x1 week. Had taken muscle relaxer last night.

## 2013-01-28 NOTE — ED Notes (Signed)
Pt out of the department for a test; family at bedside

## 2013-01-28 NOTE — ED Provider Notes (Signed)
Patient care accepted from Cherrie Distance, PA-C, pending completion of lab results for evaluation of syncope this morning. She has had no change in symptoms, appears fatigued. No further syncope. Labs/imaging are unremarkable. Discussed with Dr. Lendell Caprice of [facility] and patient admitted to observation status. Patient and family made aware of plan of care and agree. All questions answered.   Arnoldo Hooker, PA-C 01/28/13 1046

## 2013-01-28 NOTE — ED Provider Notes (Signed)
CSN: [id number]     Arrival date & time 01/28/13  4098 History   First MD Initiated Contact with Patient 01/28/13 0701     Chief Complaint  Patient presents with  . Loss of Consciousness   (Consider location/radiation/quality/duration/timing/severity/associated sxs/prior Treatment) HPI Comments: Patient is 52 year old female who presents to the ED after unwitnessed syncopal episode.  Patient states that she awoke at 0500 this morning and got up to turn on the fan before waking her children.  She reports feeling dizzy prior to this and then recalls nothing else until she found herself on the floor in the hall covered in glass.  Her daughter reports that she found her.  She states there was a Ship broker in the hall and that this was the source of the glass.  States that over the weekend that she had some nausea without vomiting, diarrhea or abdominal pain but otherwise was feeling well.  She reports that she has been under a lot of stress lately and becomes tearful while talking about gaining custody of her niece whom she can no longer afford to keep and the end result of this will be that she will have to go to foster care.  She denies any previous episodes of syncope.  Patient is a 52 y.o. female presenting with syncope. The history is provided by the patient and a relative. No language interpreter was used.  Loss of Consciousness Episode history:  Single Most recent episode:  Today Duration: unknown. Timing:  Unable to specify Progression:  Resolved Chronicity:  New Context: normal activity   Context: not blood draw, not bowel movement, not medication change, not sight of blood, not sitting down, not standing up and not urination   Witnessed: no   Relieved by:  Nothing Worsened by:  Nothing tried Ineffective treatments:  None tried Associated symptoms: anxiety, dizziness and nausea   Associated symptoms: no chest pain, no diaphoresis, no difficulty breathing, no fever, no focal weakness, no  headaches, no palpitations, no recent fall, no recent injury, no recent surgery, no rectal bleeding, no seizures, no shortness of breath, no vomiting and no weakness   Risk factors: no congenital heart disease, no coronary artery disease, no seizures and no vascular disease     Past Medical History  Diagnosis Date  . Cancer     breast   Past Surgical History  Procedure Laterality Date  . Dilation and curettage of uterus    . Breast lumpectomy Left    No family history on file. History  Substance Use Topics  . Smoking status: Former Games developer  . Smokeless tobacco: Never Used  . Alcohol Use: No   OB History   Grav Para Term Preterm Abortions TAB SAB Ect Mult Living   3 2   1   1        Review of Systems  Constitutional: Negative for fever and diaphoresis.  Respiratory: Negative for shortness of breath.   Cardiovascular: Positive for syncope. Negative for chest pain and palpitations.  Gastrointestinal: Positive for nausea. Negative for vomiting.  Neurological: Positive for dizziness and syncope. Negative for focal weakness, seizures, weakness and headaches.  All other systems reviewed and are negative.    Allergies  Dust mite extract and Pollen extract  Home Medications   Current Outpatient Rx  Name  Route  Sig  Dispense  Refill  . fexofenadine-pseudoephedrine (ALLEGRA-D 24) 180-240 MG per 24 hr tablet   Oral   Take 1 tablet by mouth daily.         Marland Kitchen  meloxicam (MOBIC) 7.5 MG tablet   Oral   Take 7.5 mg by mouth as needed for pain.         . Multiple Vitamins-Minerals (MULTIVITAMIN WITH MINERALS) tablet   Oral   Take 1 tablet by mouth daily.         . Tetrahydrozoline HCl (EYE DROPS OP)   Both Eyes   Place 1 drop into both eyes as needed (itching).          BP 130/89  Pulse 94  Temp(Src) 97.9 F (36.6 C) (Oral)  Resp 16  SpO2 99%  LMP 08/26/2011 Physical Exam  Nursing note and vitals reviewed. Constitutional: She is oriented to person, place, and  time. She appears well-developed and well-nourished. No distress.  HENT:  Head: Normocephalic.  Right Ear: External ear normal.  Left Ear: External ear normal.  Nose: Nose normal.  Mouth/Throat: Oropharynx is clear and moist. No oropharyngeal exudate.  Minor scalp soreness without focal hematoma  Eyes: Conjunctivae are normal. Pupils are equal, round, and reactive to light. No scleral icterus.  Neck: Normal range of motion. Neck supple. No tracheal deviation present.  Cardiovascular: Normal rate, regular rhythm, normal heart sounds and intact distal pulses.  Exam reveals no gallop and no friction rub.   No murmur heard. Pulmonary/Chest: Effort normal and breath sounds normal. No respiratory distress. She has no wheezes. She has no rales. She exhibits no tenderness.  Abdominal: Soft. Bowel sounds are normal. She exhibits no distension. There is no tenderness. There is no rebound and no guarding.  Musculoskeletal: Normal range of motion. She exhibits tenderness. She exhibits no edema.       Lumbar back: She exhibits tenderness. She exhibits normal range of motion and no bony tenderness.       Back:  Lymphadenopathy:    She has no cervical adenopathy.  Neurological: She is alert and oriented to person, place, and time. No cranial nerve deficit. She exhibits normal muscle tone. Coordination normal.  Skin: Skin is warm and dry. No rash noted. No erythema. No pallor.  Psychiatric: Her behavior is normal. Judgment and thought content normal.  tearful    ED Course  Procedures (including critical care time) Labs Review Labs Reviewed  CBC WITH DIFFERENTIAL  COMPREHENSIVE METABOLIC PANEL  URINALYSIS, ROUTINE W REFLEX MICROSCOPIC  D-DIMER, QUANTITATIVE   Imaging Review Dg Chest 2 View  01/28/2013   CLINICAL DATA:  No chest complaints. Syncope.  EXAM: CHEST  2 VIEW  COMPARISON:  06/07/2008.  FINDINGS: The cardiopericardial silhouette and mediastinal contours are within normal limits.  Partially radiopaque monitoring buttons project over the chest. There is no airspace disease or effusion. Trachea midline.  IMPRESSION: No active cardiopulmonary disease.   Electronically Signed   By: Andreas Newport M.D.   On: 01/28/2013 07:59    Date: 01/28/2013  Rate: 83  Rhythm: normal sinus rhythm  QRS Axis: normal  Intervals: normal  ST/T Wave abnormalities: normal  Conduction Disutrbances: none  Narrative Interpretation: Reviewed by Dr. Oletta Lamas  Old EKG Reviewed: No old EKG  9:26 AM Report to S. Upstill, PA-C who will follow for labs.  MDM  Syncope     Izola Price. Marisue Humble, [place] 01/28/13 [phone]

## 2013-01-28 NOTE — ED Notes (Signed)
CALLED TO GIVE REPORT. FLOOR HAS NOT ASSIGNED PT TO A NURSE SO THEY CANNOT TAKE REPORT

## 2013-01-28 NOTE — ED Notes (Signed)
Report called to samantha on 3w

## 2013-01-28 NOTE — ED Notes (Signed)
Patient transported to X-ray 

## 2013-01-29 ENCOUNTER — Encounter (HOSPITAL_COMMUNITY): Payer: Self-pay | Admitting: General Practice

## 2013-01-29 DIAGNOSIS — R079 Chest pain, unspecified: Secondary | ICD-10-CM

## 2013-01-29 LAB — TSH: TSH: 1.242 u[IU]/mL (ref 0.350–4.500)

## 2013-01-29 MED ORDER — INFLUENZA VAC SPLIT QUAD 0.5 ML IM SUSP
0.5000 mL | Freq: Once | INTRAMUSCULAR | Status: AC
  Administered 2013-01-29: 0.5 mL via INTRAMUSCULAR
  Filled 2013-01-29: qty 0.5

## 2013-01-29 NOTE — Evaluation (Signed)
Physical Therapy Evaluation Patient Details Name: Kristin Armstrong MRN: [id number] DOB: [birth year] Today's Date: 01/29/2013 Time: 4098-1191 PT Time Calculation (min): 13 min  PT Assessment / Plan / Recommendation History of Present Illness  Pt adm after syncopal episode.  Clinical Impression  Pt sore all over after fall due to syncope but is able to amb adequately to return home.  Instructed pt to keep mobilizing to reduce soreness.  Also instructed to continue performing active flexion of rt shoulder to improve range of motion of shoulder.    PT Assessment  Patent does not need any further PT services    Follow Up Recommendations  No PT follow up    Does the patient have the potential to tolerate intense rehabilitation      Barriers to Discharge        Equipment Recommendations  None recommended by PT    Recommendations for Other Services     Frequency      Precautions / Restrictions Precautions Precautions: None   Pertinent Vitals/Pain Soreness all over with activity.  Improved when resting.      Mobility  Bed Mobility Bed Mobility: Supine to Sit;Sitting - Scoot to Edge of Bed;Sit to Supine Supine to Sit: 6: Modified independent (Device/Increase time);HOB elevated Sitting - Scoot to Edge of Bed: 6: Modified independent (Device/Increase time) Sit to Supine: 6: Modified independent (Device/Increase time) Details for Bed Mobility Assistance: Incr time due to soreness Transfers Transfers: Sit to Stand;Stand to Sit Sit to Stand: 6: Modified independent (Device/Increase time);With upper extremity assist;From bed Stand to Sit: 6: Modified independent (Device/Increase time);With upper extremity assist;To bed Ambulation/Gait Ambulation/Gait Assistance: 6: Modified independent (Device/Increase time) Ambulation Distance (Feet): 150 Feet Ambulation/Gait Assistance Details: Antalgic on rt due to soreness after fall due to syncope. Gait Pattern: Step-through pattern;Decreased  stride length;Antalgic Gait velocity: decr    Exercises     PT Diagnosis:    PT Problem List:   PT Treatment Interventions:       PT Goals(Current goals can be found in the care plan section) Acute Rehab PT Goals PT Goal Formulation: No goals set, d/c therapy  Visit Information  Last PT Received On: 01/29/13 Assistance Needed: +1 History of Present Illness: Pt adm after syncopal episode.       Prior Functioning  Home Living Family/patient expects to be discharged to:: Private residence Living Arrangements: Children Available Help at Discharge: Family Type of Home: Apartment Home Access: Level entry Home Layout: One level Home Equipment: None Prior Function Level of Independence: Independent Communication Communication: No difficulties Dominant Hand: Right    Cognition  Cognition Arousal/Alertness: Awake/alert Behavior During Therapy: WFL for tasks assessed/performed Overall Cognitive Status: Within Functional Limits for tasks assessed    Extremity/Trunk Assessment Upper Extremity Assessment Upper Extremity Assessment: RUE deficits/detail RUE Deficits / Details: Limited shoulder flex due to pain. Lower Extremity Assessment Lower Extremity Assessment: Overall WFL for tasks assessed   Balance Balance Balance Assessed: Yes Static Standing Balance Static Standing - Balance Support: No upper extremity supported Static Standing - Level of Assistance: 7: Independent  End of Session PT - End of Session Activity Tolerance: Patient tolerated treatment well Patient left: with call bell/phone within reach  GP Functional Assessment Tool Used: clinical judgement Functional Limitation: Mobility: Walking and moving around Mobility: Walking and Moving Around Current Status (Y7829): At least 1 percent but less than 20 percent impaired, limited or restricted Mobility: Walking and Moving Around Goal Status [phone]): 0 percent impaired, limited or restricted Mobility: Walking  and  Moving Around Discharge Status [phone]): At least 1 percent but less than 20 percent impaired, limited or restricted   [facility] 01/29/2013, 11:28 AM  [facility] PT [phone]

## 2013-01-29 NOTE — Progress Notes (Signed)
  Echocardiogram 2D Echocardiogram has been performed.  Kristin Armstrong FRANCES 01/29/2013, 12:24 PM

## 2013-01-29 NOTE — Progress Notes (Signed)
DC orders received.  Patient stable with no S/S of distress.  Medication and discharge information reviewed with patient.  Patient DC home. Kristin Armstrong Marie  

## 2013-01-29 NOTE — Discharge Summary (Signed)
Physician Discharge Summary  Patient ID: Kristin Armstrong MRN: [id number] DOB/AGE: 1960-07-08 52 y.o.  Admit date: 01/28/2013 Discharge date: 01/29/2013  Primary Care Physician:  Leanor Rubenstein, MD  Discharge Diagnoses:    . Syncope episode likely vasovagal  Consults:  None   Recommendations for Outpatient Follow-up:  If patient has any further repeat episodes of syncope, may benefit from a Holter monitor outpatient  Allergies:   Allergies  Allergen Reactions  . Dust Mite Extract Other (See Comments)    sneezing  . Pollen Extract Other (See Comments)    sneezing     Discharge Medications:   Medication List         EYE DROPS OP  Place 1 drop into both eyes as needed (itching).     fexofenadine-pseudoephedrine 180-240 MG per 24 hr tablet  Commonly known as:  ALLEGRA-D 24  Take 1 tablet by mouth daily.     meloxicam 7.5 MG tablet  Commonly known as:  MOBIC  Take 7.5 mg by mouth as needed for pain.     multivitamin with minerals tablet  Take 1 tablet by mouth daily.         Brief H and P: For complete details please refer to admission H and P, but in brief, the patient is a 52 year old female who presented to ED after syncopal episode. Family reported that they heard a third, then a rash and patient had broken overall better and was unresponsive for about 5 minutes. She reportedly had a brief episode of ornament headshaking. When she opened her eyes she appeared to be placed and confused. No incontinence, lip or tongue lacerations. Patient had history of syncope as a teenager. But no seizure history. Patient reported no chest pain palpitations or any dyspnea. Patient had not been eating well for the past week due to nausea, generalized he    Hospital Course:     Syncope: Likely vasovagal, dehydration for not eating well for the past one week. Patient was admitted for observation on telemetry. She was gently hydrated with IV fluids. Orthostatic vitals were negative.  Telemetry monitor did not show any arrhythmias. EEG was done and was read by Dr. Roseanne Reno, was normal and no evidence of epileptic disorder. UA showed no UTI. Urine pregnancy test was negative. TSH 1.24. HIV nonreactive. Troponin negative. Patient had no metabolic abnormality on the labs. D-dimer within normal range. Chest x-ray showed no acute cardiopulmonary process. CT head was negative for any acute intracranial abnormality. Patient underwent 2-D echocardiogram which showed EF of 55-60%, no regional wall motion abnormalities, no valvular  Stenosis. Physical therapist evaluation was done and patient was able to ambulate adequately. No PT followup was recommended. Patient was DC'd home in stable condition.  Day of Discharge BP 108/72  Pulse 96  Temp(Src) 97.7 F (36.5 C) (Oral)  Resp 18  Ht 5\' 11"  (1.803 m)  Wt 70.035 kg (154 lb 6.4 oz)  BMI 21.54 kg/m2  SpO2 100%  LMP 08/26/2011  Physical Exam: General: Alert and awake oriented x3 not in any acute distress. HEENT: anicteric sclera, pupils reactive to light and accommodation CVS: S1-S2 clear no murmur rubs or gallops Chest: clear to auscultation bilaterally, no wheezing rales or rhonchi Abdomen: soft nontender, nondistended, normal bowel sounds, no organomegaly Extremities: no cyanosis, clubbing or edema noted bilaterally Neuro: Cranial nerves II-XII intact, no focal neurological deficits   The results of significant diagnostics from this hospitalization (including imaging, microbiology, ancillary and laboratory) are listed below for reference.  LAB RESULTS: Basic Metabolic Panel:  Recent Labs Lab 01/28/13 0820  NA 139  K 4.2  CL 102  CO2 27  GLUCOSE 96  BUN 15  CREATININE 0.90  CALCIUM 9.5   Liver Function Tests:  Recent Labs Lab 01/28/13 0820  AST 17  ALT 13  ALKPHOS 110  BILITOT 0.4  PROT 7.5  ALBUMIN 3.9   No results found for this basename: LIPASE, AMYLASE,  in the last 168 hours No results found for  this basename: AMMONIA,  in the last 168 hours CBC:  Recent Labs Lab 01/28/13 0820  WBC 5.0  NEUTROABS 3.1  HGB 14.3  HCT 40.3  MCV 82.1  PLT 229   Cardiac Enzymes: No results found for this basename: CKTOTAL, CKMB, CKMBINDEX, TROPONINI,  in the last 168 hours BNP: No components found with this basename: POCBNP,  CBG: No results found for this basename: GLUCAP,  in the last 168 hours  Significant Diagnostic Studies:  Dg Chest 2 View  01/28/2013   CLINICAL DATA:  No chest complaints. Syncope.  EXAM: CHEST  2 VIEW  COMPARISON:  06/07/2008.  FINDINGS: The cardiopericardial silhouette and mediastinal contours are within normal limits. Partially radiopaque monitoring buttons project over the chest. There is no airspace disease or effusion. Trachea midline.  IMPRESSION: No active cardiopulmonary disease.   Electronically Signed   By: Andreas Newport M.D.   On: 01/28/2013 07:59   Ct Head Wo Contrast  01/28/2013   CLINICAL DATA:  Syncope  EXAM: CT HEAD WITHOUT CONTRAST  TECHNIQUE: Contiguous axial images were obtained from the base of the skull through the vertex without intravenous contrast.  COMPARISON:  None.  FINDINGS: No mass effect, midline shift, or acute intracranial hemorrhage. Minimal chronic ischemic changes in the periventricular white matter of the occipital lobes. Extra-axial space is unremarkable. Mastoid air cells are clear. Minimal mucus in the right sphenoid sinus. Slight nasal septal deviation to the right. Cranium is intact.  IMPRESSION: No acute intracranial pathology.   Electronically Signed   By: Maryclare Bean M.D.   On: 01/28/2013 08:11    2D ECHO: Study Conclusions  Left ventricle: The cavity size was normal. Systolic function was normal. The estimated ejection fraction was in the range of 55% to 60%. Wall motion was normal; there were no regional wall motion abnormalities.    Disposition and Follow-up: Discharge Orders   Future Orders Complete By Expires   Diet  - low sodium heart healthy  As directed    Increase activity slowly  As directed        DISPOSITION: Home  DIET: Heart healthy diet  ACTIVITY: As tolerated    DISCHARGE FOLLOW-UP Follow-up Information   Follow up with Leanor Rubenstein, MD. Schedule an appointment as soon as possible for a visit in 2 weeks.   Specialty:  Family Medicine   Contact information:   [address] [place] [address] [phone]       Time spent on Discharge: 40 minutes  Signed:   Caetano Oberhaus M.D. [facility] 01/29/2013, 1:39 PM Pager: [phone]

## 2013-01-29 NOTE — Care Management Note (Signed)
    Page 1 of 1   01/29/2013     2:40:14 PM   CARE MANAGEMENT NOTE 01/29/2013  Patient:  Kristin Armstrong, Kristin Armstrong   Account Number:  [id number]  Date Initiated:  01/29/2013  Documentation initiated by:  GRAVES-BIGELOW,Karson Chicas  Subjective/Objective Assessment:   Pt admitted with syncope. Pt is from home with 2 children of her own and is the guardian of her niece.     Action/Plan:   Pt has been having financial difficulty. CM did call [facility] for price of allegra d and cost will be 18.97. CM did make pt aware of coupons online and the pharmacy will take. CSW to assist with low income housing.   Anticipated DC Date:  01/29/2013   Anticipated DC Plan:  HOME/SELF CARE  In-house referral  Clinical Social Worker      DC Planning Services  CM consult  Medication Assistance      Choice offered to / List presented to:             Status of service:  Completed, signed off Medicare Important Message given?   (If response is "NO", the following Medicare IM given date fields will be blank) Date Medicare IM given:   Date Additional Medicare IM given:    Discharge Disposition:  HOME/SELF CARE  Per UR Regulation:  Reviewed for med. necessity/level of care/duration of stay  If discussed at Long Length of Stay Meetings, dates discussed:    Comments:  01-29-13 1437 Tomi [address]amber[place], [place] [phone] Pt has PCP that works with her via cost. CM did suggest pt contact Social Services for [employer] for 2 children and to see if they can get a voucher for daycare for the niece. Her money is limited and this may help. CSW to assist also. No further needs from CM at this time.

## 2013-01-29 NOTE — Progress Notes (Signed)
Nutrition Brief Note  Patient identified on the Malnutrition Screening Tool (MST) Report for recent weight lost without trying and eating poorly because of a decreased appetite.  Per readings below, patient's weight has been stable.  Wt Readings from Last 15 Encounters:  01/29/13 154 lb 6.4 oz (70.035 kg)  07/09/12 156 lb (70.761 kg)    Body mass index is 21.54 kg/(m^2). Patient meets criteria for Normal based on current BMI.   Current diet order is Regular, patient is consuming approximately 75% of meals at this time. Labs and medications reviewed.   No nutrition interventions warranted at this time. If nutrition issues arise, please consult RD.   Maureen Chatters, RD, LDN Pager #: [phone] After-Hours Pager #: [phone]

## 2013-05-26 ENCOUNTER — Other Ambulatory Visit: Payer: Self-pay | Admitting: Obstetrics and Gynecology

## 2013-05-26 DIAGNOSIS — Z9889 Other specified postprocedural states: Secondary | ICD-10-CM

## 2013-05-26 DIAGNOSIS — Z853 Personal history of malignant neoplasm of breast: Secondary | ICD-10-CM

## 2013-06-18 ENCOUNTER — Ambulatory Visit
Admission: RE | Admit: 2013-06-18 | Discharge: 2013-06-18 | Disposition: A | Discharge status: Home / Self Care | Payer: PRIVATE HEALTH INSURANCE | Source: Ambulatory Visit | Attending: Obstetrics and Gynecology | Admitting: Obstetrics and Gynecology

## 2013-06-18 DIAGNOSIS — Z853 Personal history of malignant neoplasm of breast: Secondary | ICD-10-CM

## 2013-06-18 DIAGNOSIS — Z9889 Other specified postprocedural states: Secondary | ICD-10-CM

## 2013-06-19 ENCOUNTER — Other Ambulatory Visit: Payer: Self-pay | Admitting: Obstetrics and Gynecology

## 2013-06-19 DIAGNOSIS — Z853 Personal history of malignant neoplasm of breast: Secondary | ICD-10-CM

## 2013-06-19 DIAGNOSIS — Z9889 Other specified postprocedural states: Secondary | ICD-10-CM

## 2013-06-23 ENCOUNTER — Ambulatory Visit
Admission: RE | Admit: 2013-06-23 | Discharge: 2013-06-23 | Disposition: A | Discharge status: Home / Self Care | Payer: PRIVATE HEALTH INSURANCE | Source: Ambulatory Visit | Attending: Obstetrics and Gynecology | Admitting: Obstetrics and Gynecology

## 2013-06-23 DIAGNOSIS — Z853 Personal history of malignant neoplasm of breast: Secondary | ICD-10-CM

## 2013-06-23 DIAGNOSIS — Z9889 Other specified postprocedural states: Secondary | ICD-10-CM

## 2013-09-10 ENCOUNTER — Other Ambulatory Visit: Payer: Self-pay

## 2013-09-10 DIAGNOSIS — Z1231 Encounter for screening mammogram for malignant neoplasm of breast: Secondary | ICD-10-CM

## 2013-09-10 DIAGNOSIS — Z853 Personal history of malignant neoplasm of breast: Secondary | ICD-10-CM

## 2014-03-09 ENCOUNTER — Encounter (HOSPITAL_COMMUNITY): Payer: Self-pay | Admitting: General Practice

## 2014-05-05 ENCOUNTER — Ambulatory Visit (INDEPENDENT_AMBULATORY_CARE_PROVIDER_SITE_OTHER): Payer: PRIVATE HEALTH INSURANCE | Admitting: Family Medicine

## 2014-05-05 VITALS — BP 128/74 | HR 89 | Temp 97.8°F | Resp 16 | Ht 66.75 in | Wt 173.0 lb

## 2014-05-05 DIAGNOSIS — Z1211 Encounter for screening for malignant neoplasm of colon: Secondary | ICD-10-CM

## 2014-05-05 DIAGNOSIS — Z113 Encounter for screening for infections with a predominantly sexual mode of transmission: Secondary | ICD-10-CM

## 2014-05-05 DIAGNOSIS — Z0000 Encounter for general adult medical examination without abnormal findings: Secondary | ICD-10-CM

## 2014-05-05 DIAGNOSIS — Z1329 Encounter for screening for other suspected endocrine disorder: Secondary | ICD-10-CM

## 2014-05-05 DIAGNOSIS — Z13220 Encounter for screening for lipoid disorders: Secondary | ICD-10-CM

## 2014-05-05 DIAGNOSIS — Z124 Encounter for screening for malignant neoplasm of cervix: Secondary | ICD-10-CM

## 2014-05-05 LAB — POCT CBC
Granulocyte percent: 51.8 % (ref 37–80)
HCT, POC: 41.9 % (ref 37.7–47.9)
Hemoglobin: 13.1 g/dL (ref 12.2–16.2)
Lymph, poc: 2.6 (ref 0.6–3.4)
MCH, POC: 27.7 pg (ref 27–31.2)
MCHC: 31.3 g/dL — AB (ref 31.8–35.4)
MCV: 88.4 fL (ref 80–97)
MID (cbc): 0.4 (ref 0–0.9)
MPV: 7.1 fL (ref 0–99.8)
POC Granulocyte: 3.3 (ref 2–6.9)
POC LYMPH PERCENT: 41.9 % (ref 10–50)
POC MID %: 6.3 % (ref 0–12)
Platelet Count, POC: 244 10*3/uL (ref 142–424)
RBC: 4.74 M/uL (ref 4.04–5.48)
RDW, POC: 14.1 %
WBC: 6.3 10*3/uL (ref 4.6–10.2)

## 2014-05-05 LAB — POCT WET PREP WITH KOH
Clue Cells Wet Prep HPF POC: NEGATIVE
KOH Prep POC: NEGATIVE
Trichomonas, UA: NEGATIVE
Yeast Wet Prep HPF POC: NEGATIVE

## 2014-05-05 MED ORDER — MELOXICAM 7.5 MG PO TABS: 7.5000 mg | ORAL_TABLET | ORAL | Status: DC | PRN

## 2014-05-05 NOTE — Progress Notes (Signed)
 Chief Complaint:  Chief Complaint  Patient presents with  . Annual Exam    HPI: Kristin Armstrong is a 53 y.o. female who is here for annual visit with pap Last annual-07/2012 Last mammogram-07/2013, has had left breast cancer  Not UTD on colonscopy OB/gyn 1/24/with Dr Leo Grosser Got influenza vaccine already, works for [employer] UTD on tetanus but not sure so will check  Last LMP was 12/2012 Last pap was 08/2011, no prior abnormal pap   Past Medical History  Diagnosis Date  . Seasonal asthma     "spring" (01/29/2013)  . Anemia     hx  . Daily headache   . Chronic lower back pain   . Arthritis     "bursitis in my hips" (01/29/2013)  . Anxiety   . Depression   . Breast cancer 008    left dx 2008   Past Surgical History  Procedure Laterality Date  . Breast biopsy Left 2006  . Breast lumpectomy Left   . Tubal ligation  12/2007    Archie Endo 12/31/2007 (01/29/2013)  . Mastectomy, partial Left 07/13/2006    w/sentinel LND/notes 07/13/2006  . Dilation and curettage of uterus  1980's   History   Social History  . Marital Status: Single    Spouse Name: N/A    Number of Children: N/A  . Years of Education: N/A   Social History Main Topics  . Smoking status: Former Smoker -- 0.50 packs/day for 1 years    Types: Cigarettes  . Smokeless tobacco: Never Used     Comment: 01/29/2013 "quit smoking cigarettes in 1990"  . Alcohol Use: 1.2 oz/week    2 Glasses of wine per week  . Drug Use: No  . Sexual Activity: Yes   Other Topics Concern  . None   Social History Narrative   History reviewed. No pertinent family history. Allergies  Allergen Reactions  . Dust Mite Extract Other (See Comments)    sneezing  . Pollen Extract Other (See Comments)    sneezing   Prior to Admission medications   Medication Sig Start Date End Date Taking? Authorizing Provider  fexofenadine-pseudoephedrine (ALLEGRA-D 24) 180-240 MG per 24 hr tablet Take 1 tablet by mouth daily.   Yes Historical  Provider, MD  meloxicam (MOBIC) 7.5 MG tablet Take 7.5 mg by mouth as needed for pain.   Yes Historical Provider, MD  Multiple Vitamins-Minerals (MULTIVITAMIN WITH MINERALS) tablet Take 1 tablet by mouth daily.   Yes Historical Provider, MD  Tetrahydrozoline HCl (EYE DROPS OP) Place 1 drop into both eyes as needed (itching).   Yes Historical Provider, MD     ROS: The patient denies fevers, chills, night sweats, unintentional weight loss, chest pain, palpitations, wheezing, dyspnea on exertion, nausea, vomiting, abdominal pain, dysuria, hematuria, melena, numbness, weakness, or tingling.   All other systems have been reviewed and were otherwise negative with the exception of those mentioned in the HPI and as above.    PHYSICAL EXAM: Filed Vitals:   05/05/14 2000  BP: 128/74  Pulse: 89  Temp: 97.8 F (36.6 C)  Resp: 16   Filed Vitals:   05/05/14 2000  Height: 5' 6.75" (1.695 m)  Weight: 173 lb (78.472 kg)   Body mass index is 27.31 kg/(m^2).  General: Alert, no acute distress HEENT:  Normocephalic, atraumatic, oropharynx patent. EOMI, PERRLA, tm norml, no thyroid megaly Cardiovascular:  Regular rate and rhythm, no rubs murmurs or gallops.  No Carotid bruits, radial pulse  intact. No pedal edema.  Respiratory: Clear to auscultation bilaterally.  No wheezes, rales, or rhonchi.  No cyanosis, no use of accessory musculature GI: No organomegaly, abdomen is soft and non-tender, positive bowel sounds.  No masses. Skin: No rashes. Neurologic: Facial musculature symmetric. Psychiatric: Patient is appropriate throughout our interaction. Lymphatic: No cervical lymphadenopathy Musculoskeletal: Gait intact. Breast exam normal, she has scar tissue on left lateral breast from prior suregery GU-+ dc but maybe normal, no masses or lesions, cervix WNL   LABS: Results for orders placed or performed in visit on 05/05/14  POCT CBC  Result Value Ref Range   WBC 6.3 4.6 - 10.2 K/uL   Lymph, poc  2.6 0.6 - 3.4   POC LYMPH PERCENT 41.9 10 - 50 %L   MID (cbc) 0.4 0 - 0.9   POC MID % 6.3 0 - 12 %M   POC Granulocyte 3.3 2 - 6.9   Granulocyte percent 51.8 37 - 80 %G   RBC 4.74 4.04 - 5.48 M/uL   Hemoglobin 13.1 12.2 - 16.2 g/dL   HCT, POC 41.9 37.7 - 47.9 %   MCV 88.4 80 - 97 fL   MCH, POC 27.7 27 - 31.2 pg   MCHC 31.3 (A) 31.8 - 35.4 g/dL   RDW, POC 14.1 %   Platelet Count, POC 244 142 - 424 K/uL   MPV 7.1 0 - 99.8 fL  POCT Wet Prep with KOH  Result Value Ref Range   Trichomonas, UA Negative    Clue Cells Wet Prep HPF POC neg    Epithelial Wet Prep HPF POC 3-5    Yeast Wet Prep HPF POC neg    Bacteria Wet Prep HPF POC 1+    RBC Wet Prep HPF POC 0-2    WBC Wet Prep HPF POC 8-12    KOH Prep POC Negative      EKG/XRAY:   Primary read interpreted by Dr. Marin Comment at [facility].   ASSESSMENT/PLAN: Encounter Diagnoses  Name Primary?  . Annual physical exam Yes  . Screening for hyperlipidemia   . Screening for thyroid disorder   . Screening for colon cancer   . Screening for cervical cancer   . Screening for STD (sexually transmitted disease)    Annual labs pending Pap pending Refer to gastroentrology for colon cancer screening Advise to get yearly mammogram due to hx of breast cancer.  Fu in 1 year  Gross sideeffects, risk and benefits, and alternatives of medications d/w patient. Patient is aware that all medications have potential sideeffects and we are unable to predict every sideeffect or drug-drug interaction that may occur.  , [place], DO 05/05/2014 9:42 PM

## 2014-05-05 NOTE — Patient Instructions (Signed)
Waist was 32 inches

## 2014-05-06 ENCOUNTER — Other Ambulatory Visit: Payer: Self-pay | Admitting: Family Medicine

## 2014-05-06 ENCOUNTER — Encounter: Payer: Self-pay | Admitting: Family Medicine

## 2014-05-06 LAB — COMPLETE METABOLIC PANEL WITH GFR
ALT: 26 U/L (ref 0–35)
Albumin: 4.2 g/dL (ref 3.5–5.2)
Alkaline Phosphatase: 124 U/L — ABNORMAL HIGH (ref 39–117)
CO2: 29 mEq/L (ref 19–32)
Calcium: 9.5 mg/dL (ref 8.4–10.5)
GFR, Est African American: 66 mL/min
GFR, Est Non African American: 57 mL/min — ABNORMAL LOW
Glucose, Bld: 89 mg/dL (ref 70–99)
Total Protein: 7 g/dL (ref 6.0–8.3)

## 2014-05-06 LAB — TSH: TSH: 2.217 u[IU]/mL (ref 0.350–4.500)

## 2014-05-06 LAB — LIPID PANEL
Cholesterol: 184 mg/dL (ref 0–200)
HDL: 49 mg/dL (ref >39)
LDL Cholesterol: 103 mg/dL — ABNORMAL HIGH (ref 0–99)
Total CHOL/HDL Ratio: 3.8 ratio
Triglycerides: 160 mg/dL — ABNORMAL HIGH (ref <150)
VLDL: 32 mg/dL (ref 0–40)

## 2014-05-06 LAB — COMPLETE METABOLIC PANEL WITHOUT GFR
AST: 23 U/L (ref 0–37)
BUN: 13 mg/dL (ref 6–23)
Chloride: 104 meq/L (ref 96–112)
Creat: 1.11 mg/dL — ABNORMAL HIGH (ref 0.50–1.10)
Potassium: 3.9 meq/L (ref 3.5–5.3)
Sodium: 141 meq/L (ref 135–145)
Total Bilirubin: 0.3 mg/dL (ref 0.2–1.2)

## 2014-05-06 LAB — HIV ANTIBODY (ROUTINE TESTING W REFLEX): HIV 1&2 Ab, 4th Generation: NONREACTIVE

## 2014-05-06 LAB — HEPATITIS B SURFACE ANTIBODY, QUANTITATIVE: Hep B S AB Quant (Post): 0 m[IU]/mL

## 2014-05-06 LAB — HEPATITIS B SURFACE ANTIGEN: Hepatitis B Surface Ag: NEGATIVE

## 2014-05-06 LAB — HEPATITIS C ANTIBODY: HCV Ab: NEGATIVE

## 2014-05-07 LAB — HSV(HERPES SIMPLEX VRS) I + II AB-IGG
HSV 1 Glycoprotein G Ab, IgG: <0.1 IV
HSV 2 Glycoprotein G Ab, IgG: 8.17 IV — ABNORMAL HIGH

## 2014-05-11 LAB — PAP IG, CT-NG, RFX HPV ASCU
Chlamydia Probe Amp: NEGATIVE
GC Probe Amp: NEGATIVE

## 2014-05-20 ENCOUNTER — Telehealth: Payer: Self-pay

## 2014-05-20 NOTE — Telephone Encounter (Signed)
Pt states she had labs and a physical done,tried going on [facility] for results but wasn't able to find if she tested positive for anything, she had an odor and still have it didn't know if it was Bacterial or not. Please call [phone]

## 2014-05-21 NOTE — Telephone Encounter (Signed)
Please advise on lab results to report to pt.

## 2014-05-26 ENCOUNTER — Encounter: Payer: Self-pay | Admitting: Family Medicine

## 2014-05-26 NOTE — Telephone Encounter (Signed)
Labs have been released to MyChart. Tried to notify pt and the number is disconnected.

## 2014-05-26 NOTE — Telephone Encounter (Signed)
Can you release all her labs to her through my chart from the 05/06/15 visit. IF you are not able to do it then call her and let her know and then send her the official results through the mail. Thanks Dr Marin Comment.

## 2014-06-11 ENCOUNTER — Other Ambulatory Visit: Payer: Self-pay | Admitting: Family Medicine

## 2014-06-11 ENCOUNTER — Encounter: Payer: Self-pay | Admitting: Family Medicine

## 2014-06-11 NOTE — Telephone Encounter (Signed)
Dr Marin Comment, do you want to give RFs?

## 2014-06-11 NOTE — Telephone Encounter (Signed)
Refilled mobic, if she is still having vaignal odor/dc then consider treatment for BV but last time clues cells were neg.

## 2014-06-12 ENCOUNTER — Other Ambulatory Visit: Payer: Self-pay | Admitting: Family Medicine

## 2014-06-12 DIAGNOSIS — N760 Acute vaginitis: Principal | ICD-10-CM

## 2014-06-12 DIAGNOSIS — B9689 Other specified bacterial agents as the cause of diseases classified elsewhere: Secondary | ICD-10-CM

## 2014-06-12 MED ORDER — METRONIDAZOLE 500 MG PO TABS: 500.0000 mg | ORAL_TABLET | Freq: Two times a day (BID) | ORAL | Status: AC

## 2014-06-23 ENCOUNTER — Encounter: Payer: Self-pay | Admitting: Family Medicine

## 2014-06-24 ENCOUNTER — Ambulatory Visit: Payer: PRIVATE HEALTH INSURANCE

## 2014-06-29 ENCOUNTER — Ambulatory Visit
Admission: RE | Admit: 2014-06-29 | Discharge: 2014-06-29 | Disposition: A | Discharge status: Home / Self Care | Payer: PRIVATE HEALTH INSURANCE | Source: Ambulatory Visit

## 2014-06-29 DIAGNOSIS — Z853 Personal history of malignant neoplasm of breast: Secondary | ICD-10-CM

## 2014-06-29 DIAGNOSIS — Z1231 Encounter for screening mammogram for malignant neoplasm of breast: Secondary | ICD-10-CM

## 2014-09-06 ENCOUNTER — Ambulatory Visit (INDEPENDENT_AMBULATORY_CARE_PROVIDER_SITE_OTHER): Payer: PRIVATE HEALTH INSURANCE | Admitting: Family Medicine

## 2014-09-06 VITALS — BP 128/72 | HR 99 | Temp 98.4°F | Resp 18 | Ht 65.5 in | Wt 166.0 lb

## 2014-09-06 DIAGNOSIS — N3090 Cystitis, unspecified without hematuria: Secondary | ICD-10-CM | POA: Diagnosis not present

## 2014-09-06 DIAGNOSIS — R300 Dysuria: Secondary | ICD-10-CM | POA: Diagnosis not present

## 2014-09-06 LAB — POCT URINALYSIS DIPSTICK
Bilirubin, UA: NEGATIVE
GLUCOSE UA: NEGATIVE
Ketones, UA: NEGATIVE
NITRITE UA: NEGATIVE
Protein, UA: 100
SPEC GRAV UA: 1.02
UROBILINOGEN UA: 0.2
pH, UA: 6

## 2014-09-06 LAB — POCT UA - MICROSCOPIC ONLY
CASTS, UR, LPF, POC: NEGATIVE
Crystals, Ur, HPF, POC: NEGATIVE
MUCUS UA: NEGATIVE
Yeast, UA: NEGATIVE

## 2014-09-06 MED ORDER — NITROFURANTOIN MONOHYD MACRO 100 MG PO CAPS: 100.0000 mg | ORAL_CAPSULE | Freq: Two times a day (BID) | ORAL | Status: DC

## 2014-09-06 MED ORDER — PHENAZOPYRIDINE HCL 100 MG PO TABS: 100.0000 mg | ORAL_TABLET | Freq: Three times a day (TID) | ORAL | Status: AC | PRN

## 2014-09-06 NOTE — Patient Instructions (Signed)
Start macrobid, pyridium if needed, drink plenty of fluids. Return to the clinic or go to the nearest emergency room if any of your symptoms worsen or new symptoms occur.  Urinary Tract Infection Urinary tract infections (UTIs) can develop anywhere along your urinary tract. Your urinary tract is your body's drainage system for removing wastes and extra water. Your urinary tract includes two kidneys, two ureters, a bladder, and a urethra. Your kidneys are a pair of bean-shaped organs. Each kidney is about the size of your fist. They are located below your ribs, one on each side of your spine. CAUSES Infections are caused by microbes, which are microscopic organisms, including fungi, viruses, and bacteria. These organisms are so small that they can only be seen through a microscope. Bacteria are the microbes that most commonly cause UTIs. SYMPTOMS  Symptoms of UTIs may vary by age and gender of the patient and by the location of the infection. Symptoms in young women typically include a frequent and intense urge to urinate and a painful, burning feeling in the bladder or urethra during urination. Older women and men are more likely to be tired, shaky, and weak and have muscle aches and abdominal pain. A fever may mean the infection is in your kidneys. Other symptoms of a kidney infection include pain in your back or sides below the ribs, nausea, and vomiting. DIAGNOSIS To diagnose a UTI, your caregiver will ask you about your symptoms. Your caregiver also will ask to provide a urine sample. The urine sample will be tested for bacteria and white blood cells. White blood cells are made by your body to help fight infection. TREATMENT  Typically, UTIs can be treated with medication. Because most UTIs are caused by a bacterial infection, they usually can be treated with the use of antibiotics. The choice of antibiotic and length of treatment depend on your symptoms and the type of bacteria causing your  infection. HOME CARE INSTRUCTIONS  If you were prescribed antibiotics, take them exactly as your caregiver instructs you. Finish the medication even if you feel better after you have only taken some of the medication.  Drink enough water and fluids to keep your urine clear or pale yellow.  Avoid caffeine, tea, and carbonated beverages. They tend to irritate your bladder.  Empty your bladder often. Avoid holding urine for long periods of time.  Empty your bladder before and after sexual intercourse.  After a bowel movement, women should cleanse from front to back. Use each tissue only once. SEEK MEDICAL CARE IF:   You have back pain.  You develop a fever.  Your symptoms do not begin to resolve within 3 days. SEEK IMMEDIATE MEDICAL CARE IF:   You have severe back pain or lower abdominal pain.  You develop chills.  You have nausea or vomiting.  You have continued burning or discomfort with urination. MAKE SURE YOU:   Understand these instructions.  Will watch your condition.  Will get help right away if you are not doing well or get worse. Document Released: 02/01/2005 Document Revised: 10/24/2011 Document Reviewed: 06/02/2011 [facility] Patient Information 2015 [place], [place]. This information is not intended to replace advice given to you by your health care provider. Make sure you discuss any questions you have with your health care provider.

## 2014-09-06 NOTE — Progress Notes (Signed)
Subjective:    Patient ID: Kristin Armstrong, female    DOB: January 18, 1961, 54 y.o.   MRN: [id number]  HPI Kristin Armstrong is a 54 y.o. female  Lower abdominal discomfort, dysuria - uncomfortable, urgency/frequency. Started 3 days ago. Able to urinate - not having retention symptoms. No fever, no n/v, no back pain.   Sexually active last week, had not been sexually active in awhile. Condom was used. No vaginal discharge or abnormal bleeding.  Post menopausal. S/p D and C in 80's.   Tx: water and cranberry juice.   Patient Active Problem List   Diagnosis Date Noted  . Syncope 01/28/2013  . Breast cancer, female 07/09/2012   Past Medical History  Diagnosis Date  . Seasonal asthma     "spring" (01/29/2013)  . Anemia     hx  . Daily headache   . Chronic lower back pain   . Arthritis     "bursitis in my hips" (01/29/2013)  . Anxiety   . Depression   . Breast cancer 008    left dx 2008   Past Surgical History  Procedure Laterality Date  . Breast biopsy Left 2006  . Breast lumpectomy Left   . Tubal ligation  12/2007    Archie Endo 12/31/2007 (01/29/2013)  . Mastectomy, partial Left 07/13/2006    w/sentinel LND/notes 07/13/2006  . Dilation and curettage of uterus  1980's   Allergies  Allergen Reactions  . Dust Mite Extract Other (See Comments)    sneezing  . Pollen Extract Other (See Comments)    sneezing   Prior to Admission medications   Medication Sig Start Date End Date Taking? Authorizing Provider  fexofenadine-pseudoephedrine (ALLEGRA-D 24) 180-240 MG per 24 hr tablet Take 1 tablet by mouth daily.   Yes Historical Provider, MD  meloxicam (MOBIC) 7.5 MG tablet TAKE 1 TABLET (7.5 MG TOTAL) BY MOUTH EVERY DAY AS NEEDED FOR PAIN. 06/11/14  Yes Thao P Le, DO  Multiple Vitamins-Minerals (MULTIVITAMIN WITH MINERALS) tablet Take 1 tablet by mouth daily.   Yes Historical Provider, MD  Tetrahydrozoline HCl (EYE DROPS OP) Place 1 drop into both eyes as needed (itching).   Yes Historical  Provider, MD  metroNIDAZOLE (FLAGYL) 500 MG tablet Take 1 tablet (500 mg total) by mouth 2 (two) times daily. Patient not taking: Reported on 09/06/2014 06/12/14   Glenford Bayley, DO   History   Social History  . Marital Status: Single    Spouse Name: N/A  . Number of Children: N/A  . Years of Education: N/A   Occupational History  . Not on file.   Social History Main Topics  . Smoking status: Former Smoker -- 0.50 packs/day for 1 years    Types: Cigarettes  . Smokeless tobacco: Never Used     Comment: 01/29/2013 "quit smoking cigarettes in 1990"  . Alcohol Use: 1.2 oz/week    2 Glasses of wine per week  . Drug Use: No  . Sexual Activity: Yes   Other Topics Concern  . Not on file   Social History Narrative       Review of Systems  Constitutional: Negative for fever and chills.  Gastrointestinal: Positive for abdominal pain. Negative for nausea and vomiting.       Lower abdomen/bladder area  Genitourinary: Positive for dysuria, urgency and frequency. Negative for hematuria, vaginal bleeding, vaginal discharge, difficulty urinating, vaginal pain, menstrual problem and pelvic pain.  Musculoskeletal: Negative for back pain.  Skin: Negative for rash.  Objective:   Physical Exam  Constitutional: She is oriented to person, place, and time. She appears well-developed and well-nourished.  HENT:  Head: Normocephalic and atraumatic.  Cardiovascular: Normal rate and normal heart sounds.   Pulmonary/Chest: Effort normal and breath sounds normal.  Abdominal: Soft. Normal appearance. She exhibits no distension and no mass. There is tenderness (suprapubic). There is no guarding and no CVA tenderness.  Neurological: She is alert and oriented to person, place, and time.  Skin: Skin is warm. No rash noted.  Psychiatric: She has a normal mood and affect. Her behavior is normal.  Vitals reviewed.   Filed Vitals:   09/06/14 0931  BP: 128/72  Pulse: 99  Temp: 98.4 F (36.9 C)    TempSrc: Oral  Resp: 18  Height: 5' 5.5" (1.664 m)  Weight: 166 lb (75.297 kg)  SpO2: 98%    Results for orders placed or performed in visit on 09/06/14  POCT UA - Microscopic Only  Result Value Ref Range   WBC, Ur, HPF, POC tntc    RBC, urine, microscopic tntc    Bacteria, U Microscopic trace    Mucus, UA neg    Epithelial cells, urine per micros 0-1    Crystals, Ur, HPF, POC neg    Casts, Ur, LPF, POC neg    Yeast, UA neg   POCT urinalysis dipstick  Result Value Ref Range   Color, UA yellow    Clarity, UA turbid    Glucose, UA neg    Bilirubin, UA neg    Ketones, UA neg    Spec Grav, UA 1.020    Blood, UA large    pH, UA 6.0    Protein, UA 100    Urobilinogen, UA 0.2    Nitrite, UA neg    Leukocytes, UA large (3+)        Assessment & Plan:   Kristin Armstrong is a 54 y.o. female Burning with urination - Plan: POCT UA - Microscopic Only, POCT urinalysis dipstick, nitrofurantoin, macrocrystal-monohydrate, (MACROBID) 100 MG capsule, Urine culture, phenazopyridine (PYRIDIUM) 100 MG tablet  Cystitis - Plan: nitrofurantoin, macrocrystal-monohydrate, (MACROBID) 100 MG capsule, Urine culture, phenazopyridine (PYRIDIUM) 100 MG tablet  Start macrobid, pyridium if needed, push fluids, urine cx..  rtc precautions.    Meds ordered this encounter  Medications  . nitrofurantoin, macrocrystal-monohydrate, (MACROBID) 100 MG capsule    Sig: Take 1 capsule (100 mg total) by mouth 2 (two) times daily.    Dispense:  14 capsule    Refill:  0  . phenazopyridine (PYRIDIUM) 100 MG tablet    Sig: Take 1 tablet (100 mg total) by mouth 3 (three) times daily as needed for pain.    Dispense:  10 tablet    Refill:  0   There are no Patient Instructions on file for this visit.

## 2014-09-08 LAB — URINE CULTURE: Colony Count: >=100000

## 2014-09-16 ENCOUNTER — Encounter: Payer: Self-pay | Admitting: Family Medicine

## 2014-09-21 NOTE — Telephone Encounter (Signed)
Mychart message noted - Please call patient.  If she is still having urinary symptoms and vaginal irritation - should RTC for repeat urinalysis and likely wet prep/exam to determine cause of her symptoms. Can see any provider, as I am not back in office until Wednesday pm.

## 2014-09-25 ENCOUNTER — Ambulatory Visit (INDEPENDENT_AMBULATORY_CARE_PROVIDER_SITE_OTHER): Payer: PRIVATE HEALTH INSURANCE | Admitting: Family Medicine

## 2014-09-25 VITALS — BP 108/80 | HR 73 | Temp 97.8°F | Resp 16 | Ht 65.5 in | Wt 166.4 lb

## 2014-09-25 DIAGNOSIS — J302 Other seasonal allergic rhinitis: Secondary | ICD-10-CM | POA: Diagnosis not present

## 2014-09-25 DIAGNOSIS — R21 Rash and other nonspecific skin eruption: Secondary | ICD-10-CM

## 2014-09-25 LAB — POCT WET PREP WITH KOH
Bacteria Wet Prep HPF POC: NEGATIVE
Clue Cells Wet Prep HPF POC: NEGATIVE
KOH PREP POC: NEGATIVE
RBC Wet Prep HPF POC: NEGATIVE
Trichomonas, UA: NEGATIVE
Yeast Wet Prep HPF POC: NEGATIVE

## 2014-09-25 MED ORDER — FEXOFENADINE-PSEUDOEPHED ER 180-240 MG PO TB24: 1.0000 | ORAL_TABLET | Freq: Every day | ORAL | Status: DC

## 2014-09-25 MED ORDER — NYSTATIN 100000 UNIT/GM EX CREA: 1.0000 "application " | TOPICAL_CREAM | Freq: Two times a day (BID) | CUTANEOUS | Status: AC

## 2014-09-25 MED ORDER — VALACYCLOVIR HCL 500 MG PO TABS: 500.0000 mg | ORAL_TABLET | Freq: Every day | ORAL | Status: AC

## 2014-09-25 NOTE — Patient Instructions (Signed)
Please apply the cream as prescribed. Please take the medication as prescribed.  Alert [place] if you continue to have outbreaks in this region.

## 2014-09-28 NOTE — Progress Notes (Signed)
[facility] [address], [place] [address] [phone]  Date:  09/25/2014   Name:  Kristin Armstrong   DOB:  1960/05/27   MRN:  [id number]  PCP:  Lynne Logan, MD    History of Present Illness:  Kristin Armstrong is a 54 y.o. female patient who presents to [facility] with chief complaint of genital rash and allergy medication refill.  Rash: Patient reports a rash at her clitoris that is irritated for the 12 days.  She states that it started as red bumps, and has now turned to smaller red bumps.  She has no drainage or swelling.  She has no abnormal vaginal discharge or odor.  There is a slight rash at below her labia as well.  There is no hematuria, frequency, or dysuria.  Rash immediately followed last day of abx use for a UTI 3 weeks ago.   She is sexually active with genital to genital play though no insertion.  This occurred about 2 months ago.    Allergies: She is also requesting refill for allergy medication.  These are well controlled on the allegra D and she will be out of town for 8 weeks.  She has some nasal congestion at this time, but denies any fever, sinus pressure, cough, or fatigue.    Patient Active Problem List   Diagnosis Date Noted  . Syncope 01/28/2013  . Breast cancer, female 07/09/2012    Past Medical History  Diagnosis Date  . Seasonal asthma     "spring" (01/29/2013)  . Anemia     hx  . Daily headache   . Chronic lower back pain   . Arthritis     "bursitis in my hips" (01/29/2013)  . Anxiety   . Depression   . Breast cancer 008    left dx 2008    Past Surgical History  Procedure Laterality Date  . Breast biopsy Left 2006  . Breast lumpectomy Left   . Tubal ligation  12/2007    Archie Endo 12/31/2007 (01/29/2013)  . Mastectomy, partial Left 07/13/2006    w/sentinel LND/notes 07/13/2006  . Dilation and curettage of uterus  1980's    History  Substance Use Topics  . Smoking status: Former Smoker -- 0.50 packs/day for 1 years    Types:  Cigarettes  . Smokeless tobacco: Never Used     Comment: 01/29/2013 "quit smoking cigarettes in 1990"  . Alcohol Use: 1.2 oz/week    2 Glasses of wine per week    History reviewed. No pertinent family history.  Allergies  Allergen Reactions  . Dust Mite Extract Other (See Comments)    sneezing  . Pollen Extract Other (See Comments)    sneezing    Medication list has been reviewed and updated.  Current Outpatient Prescriptions on File Prior to Visit  Medication Sig Dispense Refill  . meloxicam (MOBIC) 7.5 MG tablet TAKE 1 TABLET (7.5 MG TOTAL) BY MOUTH EVERY DAY AS NEEDED FOR PAIN. 30 tablet 4  . Multiple Vitamins-Minerals (MULTIVITAMIN WITH MINERALS) tablet Take 1 tablet by mouth daily.    . Tetrahydrozoline HCl (EYE DROPS OP) Place 1 drop into both eyes as needed (itching).    . metroNIDAZOLE (FLAGYL) 500 MG tablet Take 1 tablet (500 mg total) by mouth 2 (two) times daily. (Patient not taking: Reported on 09/06/2014) 14 tablet 0  . phenazopyridine (PYRIDIUM) 100 MG tablet Take 1 tablet (100 mg total) by mouth 3 (three) times daily as needed for  pain. (Patient not taking: Reported on 09/25/2014) 10 tablet 0   No current facility-administered medications on file prior to visit.    ROS ROS otherwise unremarkable unless listed above.  Physical Examination: BP 108/80 mmHg  Pulse 73  Temp(Src) 97.8 F (36.6 C) (Oral)  Resp 16  Ht 5' 5.5" (1.664 m)  Wt 166 lb 6.4 oz (75.479 kg)  BMI 27.26 kg/m2  SpO2 98%  LMP 08/26/2011 Ideal Body Weight: Weight in (lb) to have BMI = 25: 152.2  Physical Exam  Constitutional: She is oriented to person, place, and time. She appears well-developed and well-nourished.  HENT:  Head: Normocephalic and atraumatic.  Eyes: Pupils are equal, round, and reactive to light.  Cardiovascular: Normal rate.   Pulmonary/Chest: Effort normal. No respiratory distress.  Genitourinary:    Pelvic exam was performed with patient supine. Cervix exhibits no  motion tenderness, no discharge and no friability. Right adnexum displays no mass. Left adnexum displays no mass. No erythema or tenderness in the vagina. No vaginal discharge found.  Clitoral hood with 2 dried deflated vesicles with small red papule centering.  Very mild tenderness to palpation.  Inferior to introitus, bilateral tiny papules that extend perianal.  No erythema or swelling.    Neurological: She is alert and oriented to person, place, and time.  Skin: Skin is warm and dry.  Psychiatric: She has a normal mood and affect. Her behavior is normal.     Assessment and Plan:  1. 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  There is no efficacy to treating these lesions at this time, but have preventative transmission and outbreak valacyclovir dosing.   Also advised nystatin, for lower rash, which she can place at the clitoral hood as well.  She will return if her symptoms do not resolve.  - POCT Wet Prep with KOH - nystatin cream (MYCOSTATIN); Apply 1 application topically 2 (two) times daily.  Dispense: 30 g; Refill: 0 - valacyclovir (VALTREX) 500 MG tablet; Take 1 tablet (500 mg total) by mouth daily.  Dispense: 30 tablet; Refill: 11 - GC/Chlamydia Probe Amp  2. Seasonal allergies - fexofenadine-pseudoephedrine (ALLEGRA-D 24) 180-240 MG per 24 hr tablet; Take 1 tablet by mouth daily.  Dispense: 60 tablet; Refill: 0   Ivar Drape, PA-C [facility] 09/28/2014 8:29 AM

## 2014-09-29 LAB — GC/CHLAMYDIA PROBE AMP
CT Probe RNA: NEGATIVE
GC Probe RNA: NEGATIVE

## 2014-10-01 NOTE — Progress Notes (Signed)
Patient discussed with Ms. English. Agree with assessment and plan of care per her note.

## 2014-11-13 ENCOUNTER — Other Ambulatory Visit: Payer: Self-pay

## 2014-11-13 DIAGNOSIS — J302 Other seasonal allergic rhinitis: Secondary | ICD-10-CM

## 2014-11-13 MED ORDER — FEXOFENADINE-PSEUDOEPHED ER 180-240 MG PO TB24: 1.0000 | ORAL_TABLET | Freq: Every day | ORAL | Status: AC

## 2015-05-31 ENCOUNTER — Other Ambulatory Visit: Payer: Self-pay

## 2015-05-31 DIAGNOSIS — Z1231 Encounter for screening mammogram for malignant neoplasm of breast: Secondary | ICD-10-CM

## 2015-06-26 ENCOUNTER — Other Ambulatory Visit: Payer: Self-pay | Admitting: Family Medicine

## 2015-07-05 ENCOUNTER — Ambulatory Visit
Admission: RE | Admit: 2015-07-05 | Discharge: 2015-07-05 | Disposition: A | Discharge status: Home / Self Care | Payer: BLUE CROSS/BLUE SHIELD | Source: Ambulatory Visit

## 2015-07-05 DIAGNOSIS — Z1231 Encounter for screening mammogram for malignant neoplasm of breast: Secondary | ICD-10-CM

## 2015-07-06 ENCOUNTER — Ambulatory Visit: Payer: PRIVATE HEALTH INSURANCE

## 2015-07-07 ENCOUNTER — Ambulatory Visit: Payer: PRIVATE HEALTH INSURANCE

## 2015-11-07 ENCOUNTER — Encounter (HOSPITAL_COMMUNITY): Payer: Self-pay | Admitting: Emergency Medicine

## 2015-11-07 ENCOUNTER — Emergency Department (HOSPITAL_COMMUNITY)
Admission: EM | Admit: 2015-11-07 | Discharge: 2015-11-07 | Disposition: A | Discharge status: Home / Self Care | Payer: BLUE CROSS/BLUE SHIELD | Attending: Emergency Medicine | Admitting: Emergency Medicine

## 2015-11-07 DIAGNOSIS — J069 Acute upper respiratory infection, unspecified: Secondary | ICD-10-CM | POA: Diagnosis not present

## 2015-11-07 DIAGNOSIS — Z853 Personal history of malignant neoplasm of breast: Secondary | ICD-10-CM | POA: Insufficient documentation

## 2015-11-07 DIAGNOSIS — M1990 Unspecified osteoarthritis, unspecified site: Secondary | ICD-10-CM | POA: Insufficient documentation

## 2015-11-07 DIAGNOSIS — H109 Unspecified conjunctivitis: Secondary | ICD-10-CM | POA: Diagnosis not present

## 2015-11-07 DIAGNOSIS — F329 Major depressive disorder, single episode, unspecified: Secondary | ICD-10-CM | POA: Diagnosis not present

## 2015-11-07 DIAGNOSIS — R05 Cough: Secondary | ICD-10-CM | POA: Diagnosis present

## 2015-11-07 DIAGNOSIS — Z87891 Personal history of nicotine dependence: Secondary | ICD-10-CM | POA: Insufficient documentation

## 2015-11-07 MED ORDER — POLYMYXIN B-TRIMETHOPRIM 10000-0.1 UNIT/ML-% OP SOLN
1.0000 [drp] | OPHTHALMIC | Status: DC
  Administered 2015-11-07: 1 [drp] via OPHTHALMIC
  Filled 2015-11-07: qty 10

## 2015-11-07 NOTE — ED Notes (Signed)
MD at bedside. 

## 2015-11-07 NOTE — ED Provider Notes (Addendum)
CSN: [id number]     Arrival date & time 11/07/15  0915 History   First MD Initiated Contact with Patient 11/07/15 [phone]     Chief Complaint  Patient presents with  . Nasal Congestion  . Cough     (Consider location/radiation/quality/duration/timing/severity/associated sxs/prior Treatment) Patient is a 55 y.o. female presenting with cough. The history is provided by the patient.  Cough Cough characteristics:  Hacking Severity:  Moderate Onset quality:  Gradual Duration:  6 days Timing:  Constant Progression:  Worsening Chronicity:  New Smoker: no   Context: sick contacts   Context comment:  Daughter with same sx Relieved by:  Nothing Worsened by:  Nothing tried Ineffective treatments: allergy meds. Associated symptoms: ear fullness, eye discharge, headaches, rhinorrhea, sinus congestion and sore throat   Associated symptoms: no chest pain, no chills, no fever, no shortness of breath and no wheezing   Risk factors: recent travel   Risk factors comment:  States all this started after taking her daughter to a photo shoot   Past Medical History  Diagnosis Date  . Seasonal asthma     "spring" (01/29/2013)  . Anemia     hx  . Daily headache   . Chronic lower back pain   . Arthritis     "bursitis in my hips" (01/29/2013)  . Anxiety   . Depression   . Breast cancer ([place]) 008    left dx 2008   Past Surgical History  Procedure Laterality Date  . Breast biopsy Left 2006  . Breast lumpectomy Left   . Tubal ligation  12/2007    Archie Endo 12/31/2007 (01/29/2013)  . Mastectomy, partial Left 07/13/2006    w/sentinel LND/notes 07/13/2006  . Dilation and curettage of uterus  1980's   No family history on file. Social History  Substance Use Topics  . Smoking status: Former Smoker -- 0.50 packs/day for 1 years    Types: Cigarettes  . Smokeless tobacco: Never Used     Comment: 01/29/2013 "quit smoking cigarettes in 1990"  . Alcohol Use: 1.2 oz/week    2 Glasses of wine per week   OB  History    Gravida Para Term Preterm AB TAB SAB Ectopic Multiple Living   3 2   1   1        Review of Systems  Constitutional: Negative for fever and chills.  HENT: Positive for rhinorrhea and sore throat.   Eyes: Positive for discharge.  Respiratory: Positive for cough. Negative for shortness of breath and wheezing.   Cardiovascular: Negative for chest pain.  Neurological: Positive for headaches.  All other systems reviewed and are negative.     Allergies  Dust mite extract and Pollen extract  Home Medications   Prior to Admission medications   Medication Sig Start Date End Date Taking? Authorizing Provider  fexofenadine-pseudoephedrine (ALLEGRA-D 24) 180-240 MG per 24 hr tablet Take 1 tablet by mouth daily. 11/13/14   Dorian Heckle English, PA  meloxicam (MOBIC) 7.5 MG tablet TAKE 1 TABLET (7.5 MG TOTAL) BY MOUTH EVERY DAY AS NEEDED FOR PAIN. 06/11/14   Thao P Le, DO  metroNIDAZOLE (FLAGYL) 500 MG tablet Take 1 tablet (500 mg total) by mouth 2 (two) times daily. Patient not taking: Reported on 09/06/2014 06/12/14   Thao P Le, DO  Multiple Vitamins-Minerals (MULTIVITAMIN WITH MINERALS) tablet Take 1 tablet by mouth daily.    Historical Provider, MD  nystatin cream (MYCOSTATIN) Apply 1 application topically 2 (two) times daily. 09/25/14   Dorian Heckle  English, PA  phenazopyridine (PYRIDIUM) 100 MG tablet Take 1 tablet (100 mg total) by mouth 3 (three) times daily as needed for pain. Patient not taking: Reported on 09/25/2014 09/06/14   Wendie Agreste, MD  Tetrahydrozoline HCl (EYE DROPS OP) Place 1 drop into both eyes as needed (itching).    Historical Provider, MD  valACYclovir (VALTREX) 500 MG tablet Take 1 tablet (500 mg total) by mouth daily. 09/25/14   Joretta Bachelor, PA   LMP 08/26/2011 Physical Exam  Constitutional: She is oriented to person, place, and time. She appears well-developed and well-nourished. No distress.  HENT:  Head: Normocephalic and atraumatic.  Right Ear:  Tympanic membrane normal.  Left Ear: Tympanic membrane normal.  Nose: Mucosal edema present.  Mouth/Throat: Posterior oropharyngeal erythema present. No oropharyngeal exudate or posterior oropharyngeal edema.  Eyes: EOM are normal. Pupils are equal, round, and reactive to light. Right eye exhibits exudate. Right conjunctiva is injected. Right conjunctiva has no hemorrhage.  Neck: Normal range of motion. Neck supple.  Cardiovascular: Normal rate, regular rhythm and intact distal pulses.   No murmur heard. Pulmonary/Chest: Effort normal and breath sounds normal. No respiratory distress. She has no wheezes. She has no rales.  Abdominal: Soft. She exhibits no distension. There is no tenderness. There is no rebound and no guarding.  Musculoskeletal: Normal range of motion. She exhibits no edema or tenderness.  Lymphadenopathy:    She has no cervical adenopathy.  Neurological: She is alert and oriented to person, place, and time.  Skin: Skin is warm and dry. No rash noted. No erythema.  Psychiatric: She has a normal mood and affect. Her behavior is normal.  Nursing note and vitals reviewed.   ED Course  Procedures (including critical care time) Labs Review Labs Reviewed - No data to display  Imaging Review No results found. I have personally reviewed and evaluated these images and lab results as part of my medical decision-making.   EKG Interpretation None      MDM   Final diagnoses:  URI (upper respiratory infection)  Conjunctivitis, right eye    Pt with symptoms consistent with viral URI and right sided conjunctivits.  Well appearing here.  No signs of breathing difficulty  No signs of pharyngitis, otitis or abnormal abdominal findings.   Pt given polytrim drops and encouraged to use OTC meds.   pt to return with any further problems.     Blanchie Dessert, MD 11/07/15 [id number]  Blanchie Dessert, MD 11/07/15 [phone]

## 2015-11-07 NOTE — ED Notes (Signed)
Patient c/o nasal congestion, cough and headache since Monday last week. Denies any fevers.

## 2016-02-09 ENCOUNTER — Other Ambulatory Visit: Payer: Self-pay | Admitting: Family Medicine

## 2016-02-09 ENCOUNTER — Other Ambulatory Visit: Payer: Self-pay | Admitting: Physician Assistant

## 2016-02-09 DIAGNOSIS — J302 Other seasonal allergic rhinitis: Secondary | ICD-10-CM

## 2016-05-17 ENCOUNTER — Other Ambulatory Visit: Payer: Self-pay | Admitting: Obstetrics and Gynecology

## 2016-05-17 DIAGNOSIS — Z1231 Encounter for screening mammogram for malignant neoplasm of breast: Secondary | ICD-10-CM

## 2016-07-06 ENCOUNTER — Ambulatory Visit
Admission: RE | Admit: 2016-07-06 | Discharge: 2016-07-06 | Disposition: A | Discharge status: Home / Self Care | Payer: BLUE CROSS/BLUE SHIELD | Source: Ambulatory Visit | Attending: Obstetrics and Gynecology | Admitting: Obstetrics and Gynecology

## 2016-07-06 DIAGNOSIS — Z1231 Encounter for screening mammogram for malignant neoplasm of breast: Secondary | ICD-10-CM

## 2016-07-18 ENCOUNTER — Ambulatory Visit: Payer: BLUE CROSS/BLUE SHIELD

## 2017-06-25 ENCOUNTER — Other Ambulatory Visit: Payer: Self-pay | Admitting: Obstetrics and Gynecology

## 2017-06-25 DIAGNOSIS — Z1231 Encounter for screening mammogram for malignant neoplasm of breast: Secondary | ICD-10-CM

## 2017-07-13 ENCOUNTER — Ambulatory Visit: Payer: BLUE CROSS/BLUE SHIELD

## 2017-07-26 ENCOUNTER — Ambulatory Visit: Payer: BLUE CROSS/BLUE SHIELD

## 2017-08-13 ENCOUNTER — Ambulatory Visit: Payer: Self-pay

## 2017-10-01 ENCOUNTER — Encounter: Payer: Self-pay | Admitting: Family Medicine

## 2017-10-03 ENCOUNTER — Ambulatory Visit
Admission: RE | Admit: 2017-10-03 | Discharge: 2017-10-03 | Disposition: A | Discharge status: Home / Self Care | Payer: 59 | Source: Ambulatory Visit | Attending: Obstetrics and Gynecology | Admitting: Obstetrics and Gynecology

## 2017-10-03 DIAGNOSIS — Z1231 Encounter for screening mammogram for malignant neoplasm of breast: Secondary | ICD-10-CM

## 2018-04-22 ENCOUNTER — Emergency Department (HOSPITAL_COMMUNITY): Payer: 59

## 2018-04-22 ENCOUNTER — Emergency Department (HOSPITAL_COMMUNITY)
Admission: EM | Admit: 2018-04-22 | Discharge: 2018-04-22 | Disposition: A | Discharge status: Home / Self Care | Payer: 59 | Attending: Emergency Medicine | Admitting: Emergency Medicine

## 2018-04-22 ENCOUNTER — Encounter (HOSPITAL_COMMUNITY): Payer: Self-pay

## 2018-04-22 DIAGNOSIS — Y939 Activity, unspecified: Secondary | ICD-10-CM | POA: Insufficient documentation

## 2018-04-22 DIAGNOSIS — Y999 Unspecified external cause status: Secondary | ICD-10-CM | POA: Diagnosis not present

## 2018-04-22 DIAGNOSIS — Y9210 Unspecified residential institution as the place of occurrence of the external cause: Secondary | ICD-10-CM | POA: Insufficient documentation

## 2018-04-22 DIAGNOSIS — M7918 Myalgia, other site: Secondary | ICD-10-CM | POA: Diagnosis present

## 2018-04-22 MED ORDER — METHOCARBAMOL 500 MG PO TABS: 500.0000 mg | ORAL_TABLET | Freq: Two times a day (BID) | ORAL | 0 refills | Status: AC

## 2018-04-22 MED ORDER — IBUPROFEN 400 MG PO TABS
600.0000 mg | ORAL_TABLET | Freq: Once | ORAL | Status: AC
  Administered 2018-04-22: 600 mg via ORAL
  Filled 2018-04-22: qty 1
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
## 2018-04-22 NOTE — ED Notes (Signed)
Patient transported to X-ray 

## 2018-04-22 NOTE — Discharge Instructions (Addendum)
Please read attached information. If you experience any new or worsening signs or symptoms please return to the emergency room for evaluation. Please follow-up with your primary care provider or specialist as discussed. Please use medication prescribed only as directed and discontinue taking if you have any concerning signs or symptoms.   °

## 2018-11-18 ENCOUNTER — Other Ambulatory Visit: Payer: Self-pay | Admitting: Obstetrics and Gynecology

## 2018-11-18 DIAGNOSIS — Z1231 Encounter for screening mammogram for malignant neoplasm of breast: Secondary | ICD-10-CM

## 2018-12-30 ENCOUNTER — Ambulatory Visit
Admission: RE | Admit: 2018-12-30 | Discharge: 2018-12-30 | Disposition: A | Discharge status: Home / Self Care | Payer: 59 | Source: Ambulatory Visit | Attending: Obstetrics and Gynecology | Admitting: Obstetrics and Gynecology

## 2018-12-30 ENCOUNTER — Other Ambulatory Visit: Payer: Self-pay

## 2018-12-30 DIAGNOSIS — Z1231 Encounter for screening mammogram for malignant neoplasm of breast: Secondary | ICD-10-CM

## 2019-03-05 ENCOUNTER — Other Ambulatory Visit: Payer: Self-pay

## 2019-03-05 DIAGNOSIS — Z20822 Contact with and (suspected) exposure to covid-19: Secondary | ICD-10-CM

## 2019-03-07 LAB — NOVEL CORONAVIRUS, NAA: SARS-CoV-2, NAA: NOT DETECTED

## 2019-12-03 ENCOUNTER — Other Ambulatory Visit: Payer: Self-pay | Admitting: Obstetrics and Gynecology

## 2019-12-03 DIAGNOSIS — Z1231 Encounter for screening mammogram for malignant neoplasm of breast: Secondary | ICD-10-CM

## 2020-01-07 ENCOUNTER — Other Ambulatory Visit: Payer: Self-pay

## 2020-01-07 ENCOUNTER — Ambulatory Visit
Admission: RE | Admit: 2020-01-07 | Discharge: 2020-01-07 | Disposition: A | Discharge status: Home / Self Care | Payer: BC Managed Care – PPO | Source: Ambulatory Visit | Attending: Obstetrics and Gynecology | Admitting: Obstetrics and Gynecology

## 2020-01-07 DIAGNOSIS — Z1231 Encounter for screening mammogram for malignant neoplasm of breast: Secondary | ICD-10-CM

## 2020-05-20 ENCOUNTER — Other Ambulatory Visit: Payer: BC Managed Care – PPO

## 2020-05-20 DIAGNOSIS — Z20822 Contact with and (suspected) exposure to covid-19: Secondary | ICD-10-CM

## 2020-05-22 LAB — NOVEL CORONAVIRUS, NAA: SARS-CoV-2, NAA: NOT DETECTED

## 2020-05-22 LAB — SARS-COV-2, NAA 2 DAY TAT

## 2021-02-23 ENCOUNTER — Other Ambulatory Visit: Payer: Self-pay | Admitting: Obstetrics and Gynecology

## 2021-02-23 DIAGNOSIS — Z1231 Encounter for screening mammogram for malignant neoplasm of breast: Secondary | ICD-10-CM

## 2021-03-23 ENCOUNTER — Ambulatory Visit
Admission: RE | Admit: 2021-03-23 | Discharge: 2021-03-23 | Disposition: A | Discharge status: Home / Self Care | Payer: BC Managed Care – PPO | Source: Ambulatory Visit | Attending: Obstetrics and Gynecology | Admitting: Obstetrics and Gynecology

## 2021-03-23 ENCOUNTER — Other Ambulatory Visit: Payer: Self-pay

## 2021-03-23 ENCOUNTER — Ambulatory Visit: Payer: BC Managed Care – PPO

## 2021-03-23 DIAGNOSIS — Z1231 Encounter for screening mammogram for malignant neoplasm of breast: Secondary | ICD-10-CM

## 2022-03-02 ENCOUNTER — Other Ambulatory Visit: Payer: Self-pay | Admitting: Family Medicine

## 2022-03-02 DIAGNOSIS — Z1231 Encounter for screening mammogram for malignant neoplasm of breast: Secondary | ICD-10-CM

## 2022-04-26 ENCOUNTER — Ambulatory Visit: Payer: BC Managed Care – PPO

## 2022-04-28 ENCOUNTER — Ambulatory Visit
Admission: RE | Admit: 2022-04-28 | Discharge: 2022-04-28 | Disposition: A | Discharge status: Home / Self Care | Payer: BC Managed Care – PPO | Source: Ambulatory Visit | Attending: Family Medicine | Admitting: Family Medicine

## 2022-04-28 DIAGNOSIS — Z1231 Encounter for screening mammogram for malignant neoplasm of breast: Secondary | ICD-10-CM

## 2022-09-13 ENCOUNTER — Other Ambulatory Visit: Payer: Self-pay

## 2022-09-13 ENCOUNTER — Emergency Department (HOSPITAL_COMMUNITY): Payer: BC Managed Care – PPO

## 2022-09-13 ENCOUNTER — Emergency Department (HOSPITAL_COMMUNITY)
Admission: EM | Admit: 2022-09-13 | Discharge: 2022-09-13 | Disposition: A | Discharge status: Home / Self Care | Payer: BC Managed Care – PPO | Attending: Emergency Medicine | Admitting: Emergency Medicine

## 2022-09-13 DIAGNOSIS — G8911 Acute pain due to trauma: Secondary | ICD-10-CM | POA: Insufficient documentation

## 2022-09-13 DIAGNOSIS — R040 Epistaxis: Secondary | ICD-10-CM | POA: Insufficient documentation

## 2022-09-13 DIAGNOSIS — S46811A Strain of other muscles, fascia and tendons at shoulder and upper arm level, right arm, initial encounter: Secondary | ICD-10-CM | POA: Diagnosis not present

## 2022-09-13 DIAGNOSIS — M542 Cervicalgia: Secondary | ICD-10-CM | POA: Diagnosis present

## 2022-09-13 DIAGNOSIS — M545 Low back pain, unspecified: Secondary | ICD-10-CM | POA: Insufficient documentation

## 2022-09-13 DIAGNOSIS — Y92410 Unspecified street and highway as the place of occurrence of the external cause: Secondary | ICD-10-CM | POA: Insufficient documentation

## 2022-09-13 DIAGNOSIS — M79644 Pain in right finger(s): Secondary | ICD-10-CM | POA: Insufficient documentation
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
## 2022-09-13 NOTE — Discharge Instructions (Signed)
Your x-ray did not show an obvious broken bone.  As we discussed I am worried about a possible ligamentous injury.  Please follow-up with a hand surgeon in the office.  The address and phone number are provided in the paperwork.  You will hurt worse tomorrow, that is normal.  You should get progressively better over the course of the week.  If you are still having pain about a week from today then you should try to see your family doctor and they may consider obtaining further imaging.  Take 4 over the counter ibuprofen tablets 3 times a day or 2 over-the-counter naproxen tablets twice a day for pain. Also take tylenol 1000mg (2 extra strength) four times a day.
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
## 2023-04-26 ENCOUNTER — Other Ambulatory Visit: Payer: Self-pay | Admitting: Family Medicine

## 2023-04-26 DIAGNOSIS — Z0000 Encounter for general adult medical examination without abnormal findings: Secondary | ICD-10-CM

## 2023-05-30 ENCOUNTER — Ambulatory Visit: Payer: BC Managed Care – PPO

## 2023-06-25 ENCOUNTER — Ambulatory Visit
Admission: RE | Admit: 2023-06-25 | Discharge: 2023-06-25 | Disposition: A | Discharge status: Home / Self Care | Payer: BC Managed Care – PPO | Source: Ambulatory Visit | Attending: Family Medicine | Admitting: Family Medicine

## 2023-06-25 DIAGNOSIS — Z0000 Encounter for general adult medical examination without abnormal findings: Secondary | ICD-10-CM
